# Patient Record
Sex: Male | Born: 1980
Health system: Southern US, Community
[De-identification: ages and names within clinical notes are randomized; demographics above are authoritative.]

## PROBLEM LIST (undated history)

## (undated) DIAGNOSIS — N289 Disorder of kidney and ureter, unspecified: Secondary | ICD-10-CM

## (undated) HISTORY — PX: TONSILLECTOMY: SUR1361

---

## 2010-09-12 ENCOUNTER — Emergency Department (HOSPITAL_COMMUNITY)
Admission: EM | Admit: 2010-09-12 | Discharge: 2010-09-12 | Disposition: A | Discharge status: Home / Self Care | Payer: Worker's Compensation | Attending: Emergency Medicine | Admitting: Emergency Medicine

## 2010-09-12 DIAGNOSIS — L089 Local infection of the skin and subcutaneous tissue, unspecified: Secondary | ICD-10-CM | POA: Insufficient documentation

## 2010-09-12 DIAGNOSIS — W57XXXA Bitten or stung by nonvenomous insect and other nonvenomous arthropods, initial encounter: Secondary | ICD-10-CM | POA: Insufficient documentation

## 2012-06-08 ENCOUNTER — Telehealth (HOSPITAL_COMMUNITY): Payer: Self-pay | Admitting: Dietician

## 2012-06-08 NOTE — Telephone Encounter (Signed)
Called pt at 1602. Appointment scheduled for 06/16/12 at 2:00 PM.

## 2012-06-08 NOTE — Telephone Encounter (Signed)
Edit to previous letter

## 2012-06-08 NOTE — Telephone Encounter (Signed)
Mailed appointment confirmation letter and instructions for appointment scheduled 06/16/12 via [place] [facility].

## 2012-06-08 NOTE — Telephone Encounter (Signed)
Received referral via fax from [facility] for dx: obesity.  

## 2012-06-16 ENCOUNTER — Telehealth (HOSPITAL_COMMUNITY): Payer: Self-pay | Admitting: Dietician

## 2012-06-16 NOTE — Telephone Encounter (Signed)
Pt was a no show for appointment scheduled for 06/16/12 at 2:00 PM. Mailed letter to pt home notifying pt of no-show and offering rescheduling appointment.

## 2013-09-08 ENCOUNTER — Other Ambulatory Visit (HOSPITAL_COMMUNITY): Payer: Self-pay | Admitting: Family Medicine

## 2013-09-09 ENCOUNTER — Other Ambulatory Visit (HOSPITAL_COMMUNITY): Payer: Self-pay | Admitting: Family Medicine

## 2013-09-09 DIAGNOSIS — M25519 Pain in unspecified shoulder: Secondary | ICD-10-CM

## 2013-09-17 ENCOUNTER — Other Ambulatory Visit (HOSPITAL_COMMUNITY): Payer: Worker's Compensation

## 2015-08-14 DIAGNOSIS — Z6835 Body mass index (BMI) 35.0-35.9, adult: Secondary | ICD-10-CM | POA: Diagnosis not present

## 2015-08-14 DIAGNOSIS — J019 Acute sinusitis, unspecified: Secondary | ICD-10-CM | POA: Diagnosis not present

## 2015-08-14 DIAGNOSIS — G894 Chronic pain syndrome: Secondary | ICD-10-CM | POA: Diagnosis not present

## 2015-08-14 DIAGNOSIS — J111 Influenza due to unidentified influenza virus with other respiratory manifestations: Secondary | ICD-10-CM | POA: Diagnosis not present

## 2015-08-14 DIAGNOSIS — Z1389 Encounter for screening for other disorder: Secondary | ICD-10-CM | POA: Diagnosis not present

## 2015-10-04 DIAGNOSIS — F17200 Nicotine dependence, unspecified, uncomplicated: Secondary | ICD-10-CM | POA: Diagnosis not present

## 2015-10-04 DIAGNOSIS — M549 Dorsalgia, unspecified: Secondary | ICD-10-CM | POA: Diagnosis not present

## 2015-10-04 DIAGNOSIS — N200 Calculus of kidney: Secondary | ICD-10-CM | POA: Diagnosis not present

## 2015-10-04 DIAGNOSIS — K5900 Constipation, unspecified: Secondary | ICD-10-CM | POA: Diagnosis not present

## 2015-11-26 DIAGNOSIS — R1032 Left lower quadrant pain: Secondary | ICD-10-CM | POA: Diagnosis not present

## 2015-11-26 DIAGNOSIS — F17200 Nicotine dependence, unspecified, uncomplicated: Secondary | ICD-10-CM | POA: Diagnosis not present

## 2015-11-26 DIAGNOSIS — Z87442 Personal history of urinary calculi: Secondary | ICD-10-CM | POA: Diagnosis not present

## 2015-11-26 DIAGNOSIS — N132 Hydronephrosis with renal and ureteral calculous obstruction: Secondary | ICD-10-CM | POA: Diagnosis not present

## 2015-11-26 DIAGNOSIS — N201 Calculus of ureter: Secondary | ICD-10-CM | POA: Diagnosis not present

## 2015-11-26 DIAGNOSIS — R000 Tachycardia, unspecified: Secondary | ICD-10-CM | POA: Diagnosis not present

## 2015-12-05 DIAGNOSIS — G894 Chronic pain syndrome: Secondary | ICD-10-CM | POA: Diagnosis not present

## 2015-12-05 DIAGNOSIS — N200 Calculus of kidney: Secondary | ICD-10-CM | POA: Diagnosis not present

## 2015-12-05 DIAGNOSIS — N202 Calculus of kidney with calculus of ureter: Secondary | ICD-10-CM | POA: Diagnosis not present

## 2015-12-05 DIAGNOSIS — F909 Attention-deficit hyperactivity disorder, unspecified type: Secondary | ICD-10-CM | POA: Diagnosis not present

## 2015-12-05 DIAGNOSIS — E748 Other specified disorders of carbohydrate metabolism: Secondary | ICD-10-CM | POA: Diagnosis not present

## 2015-12-05 DIAGNOSIS — Z1389 Encounter for screening for other disorder: Secondary | ICD-10-CM | POA: Diagnosis not present

## 2015-12-05 DIAGNOSIS — F419 Anxiety disorder, unspecified: Secondary | ICD-10-CM | POA: Diagnosis not present

## 2015-12-05 DIAGNOSIS — Z6834 Body mass index (BMI) 34.0-34.9, adult: Secondary | ICD-10-CM | POA: Diagnosis not present

## 2017-03-13 DIAGNOSIS — E669 Obesity, unspecified: Secondary | ICD-10-CM | POA: Diagnosis not present

## 2017-03-13 DIAGNOSIS — G894 Chronic pain syndrome: Secondary | ICD-10-CM | POA: Diagnosis not present

## 2017-03-13 DIAGNOSIS — Z1389 Encounter for screening for other disorder: Secondary | ICD-10-CM | POA: Diagnosis not present

## 2017-03-13 DIAGNOSIS — F419 Anxiety disorder, unspecified: Secondary | ICD-10-CM | POA: Diagnosis not present

## 2017-03-13 DIAGNOSIS — Z6834 Body mass index (BMI) 34.0-34.9, adult: Secondary | ICD-10-CM | POA: Diagnosis not present

## 2017-07-14 DIAGNOSIS — Z6835 Body mass index (BMI) 35.0-35.9, adult: Secondary | ICD-10-CM | POA: Diagnosis not present

## 2017-07-14 DIAGNOSIS — F419 Anxiety disorder, unspecified: Secondary | ICD-10-CM | POA: Diagnosis not present

## 2017-07-14 DIAGNOSIS — G894 Chronic pain syndrome: Secondary | ICD-10-CM | POA: Diagnosis not present

## 2017-07-14 DIAGNOSIS — Z1389 Encounter for screening for other disorder: Secondary | ICD-10-CM | POA: Diagnosis not present

## 2017-07-14 DIAGNOSIS — J329 Chronic sinusitis, unspecified: Secondary | ICD-10-CM | POA: Diagnosis not present

## 2017-07-14 DIAGNOSIS — E6609 Other obesity due to excess calories: Secondary | ICD-10-CM | POA: Diagnosis not present

## 2017-07-14 DIAGNOSIS — J309 Allergic rhinitis, unspecified: Secondary | ICD-10-CM | POA: Diagnosis not present

## 2017-07-14 DIAGNOSIS — E669 Obesity, unspecified: Secondary | ICD-10-CM | POA: Diagnosis not present

## 2017-09-15 ENCOUNTER — Other Ambulatory Visit: Payer: Self-pay

## 2017-09-15 ENCOUNTER — Emergency Department (HOSPITAL_COMMUNITY): Payer: BLUE CROSS/BLUE SHIELD

## 2017-09-15 ENCOUNTER — Encounter (HOSPITAL_COMMUNITY): Payer: Self-pay | Admitting: Emergency Medicine

## 2017-09-15 ENCOUNTER — Emergency Department (HOSPITAL_COMMUNITY)
Admission: EM | Admit: 2017-09-15 | Discharge: 2017-09-15 | Disposition: A | Discharge status: Home / Self Care | Payer: BLUE CROSS/BLUE SHIELD | Attending: Emergency Medicine | Admitting: Emergency Medicine

## 2017-09-15 DIAGNOSIS — Z87891 Personal history of nicotine dependence: Secondary | ICD-10-CM | POA: Diagnosis not present

## 2017-09-15 DIAGNOSIS — R05 Cough: Secondary | ICD-10-CM | POA: Diagnosis not present

## 2017-09-15 DIAGNOSIS — R0602 Shortness of breath: Secondary | ICD-10-CM | POA: Diagnosis not present

## 2017-09-15 DIAGNOSIS — J40 Bronchitis, not specified as acute or chronic: Secondary | ICD-10-CM | POA: Diagnosis not present

## 2017-09-15 DIAGNOSIS — Z79899 Other long term (current) drug therapy: Secondary | ICD-10-CM | POA: Insufficient documentation

## 2017-09-15 MED ORDER — AZITHROMYCIN 250 MG PO TABS: 250.0000 mg | ORAL_TABLET | Freq: Every day | ORAL | 0 refills | Status: DC

## 2017-09-15 MED ORDER — PROMETHAZINE-CODEINE 6.25-10 MG/5ML PO SYRP: 10.0000 mL | ORAL_SOLUTION | Freq: Four times a day (QID) | ORAL | 0 refills | Status: DC | PRN

## 2017-09-15 MED ORDER — DEXAMETHASONE SODIUM PHOSPHATE 4 MG/ML IJ SOLN
8.0000 mg | Freq: Once | INTRAMUSCULAR | Status: AC
  Administered 2017-09-15: 8 mg via INTRAMUSCULAR
  Filled 2017-09-15: qty 2

## 2017-09-15 NOTE — Discharge Instructions (Addendum)
Chest x-ray showed no pneumonia.  Prescription for antibiotic and cough syrup.  Increase fluids.  Continue your over-the-counter medications

## 2017-09-15 NOTE — ED Triage Notes (Signed)
Pt c/o of  Productive cough and congestion x 1 month.

## 2017-09-15 NOTE — ED Provider Notes (Signed)
[facility] EMERGENCY DEPARTMENT Provider Note   CSN: [id number] Arrival date & time: 09/15/17  4098     History   Chief Complaint Chief Complaint  Patient presents with  . Cough    HPI Willie Cook is a 37 y.o. male.  Patient presents with persistent cough for 1 month with associated productive sputum.  He blames some of this on allergies.  He was given an intramuscular injection (uncertain medication) at [facility] 6 weeks ago.  No fever, sweats, chills, weight loss.  Quit smoking several months ago.  Severity of symptoms is moderate.  Pollen makes symptoms worse.     History reviewed. No pertinent past medical history.  There are no active problems to display for this patient.   History reviewed. No pertinent surgical history.      Home Medications    Prior to Admission medications   Medication Sig Start Date End Date Taking? Authorizing Provider  ALPRAZolam Prudy Feeler) 0.5 MG tablet Take 1 tablet by mouth 4 (four) times daily as needed. 09/11/17   [provider]  azithromycin (ZITHROMAX) 250 MG tablet Take 1 tablet (250 mg total) by mouth daily. Take first 2 tablets together, then 1 every day until finished. 09/15/17   Donnetta Hutching, MD  HYDROcodone-acetaminophen [facility]) 10-325 MG tablet Take 1-2 tablets by mouth. 5 times a day as needed 09/11/17   [provider]  promethazine-codeine (PHENERGAN WITH CODEINE) 6.25-10 MG/5ML syrup Take 10 mLs by mouth every 6 (six) hours as needed for cough. 09/15/17   Donnetta Hutching, MD    Family History History reviewed. No pertinent family history.  Social History Social History   Tobacco Use  . Smoking status: Former Games developer  . Smokeless tobacco: Never Used  Substance Use Topics  . Alcohol use: Yes    Frequency: Never  . Drug use: Never     Allergies   Patient has no allergy information on record.   Review of Systems Review of Systems  All other systems reviewed and are negative.    Physical  Exam Updated Vital Signs BP 111/83   Pulse 84   Temp 98.9 F (37.2 C) (Oral)   Resp 16   Ht 6' (1.829 m)   Wt 117.9 kg (260 lb)   SpO2 99%   BMI 35.26 kg/m   Physical Exam  Constitutional: He is oriented to person, place, and time. He appears well-developed and well-nourished.  nad  HENT:  Head: Normocephalic and atraumatic.  Eyes: Conjunctivae are normal.  Neck: Neck supple.  Cardiovascular: Normal rate and regular rhythm.  Pulmonary/Chest: Effort normal and breath sounds normal.  Abdominal: Soft. Bowel sounds are normal.  Musculoskeletal: Normal range of motion.  Neurological: He is alert and oriented to person, place, and time.  Skin: Skin is warm and dry.  Psychiatric: He has a normal mood and affect. His behavior is normal.  Nursing note and vitals reviewed.    ED Treatments / Results  Labs (all labs ordered are listed, but only abnormal results are displayed) Labs Reviewed - No data to display  EKG None  Radiology Dg Chest 2 View  Result Date: 09/15/2017 CLINICAL DATA:  Productive cough, congestion, shortness of breath, and bilateral lower chest discomfort for the past month. Slight fever today. Former smoker. EXAM: CHEST - 2 VIEW COMPARISON:  None FINDINGS: The lungs are adequately inflated. The interstitial markings are coarse. There is no alveolar infiltrate or pleural effusion. Heart and pulmonary vascularity are normal. The mediastinum is normal  in width. The trachea is midline. There is no pleural effusion. IMPRESSION: There is no pneumonia. Mild chronic bronchitic-smoking related interstitial changes are present. Electronically Signed   By: David  [place] M.D.   On: 09/15/2017 07:39    Procedures Procedures (including critical care time)  Medications Ordered in ED Medications  dexamethasone (DECADRON) injection 8 mg (8 mg Intramuscular Given 09/15/17 1610)     Initial Impression / Assessment and Plan / ED Course  I have reviewed the triage vital signs  and the nursing notes.  Pertinent labs & imaging results that were available during my care of the patient were reviewed by me and considered in my medical decision making (see chart for details).     Presents with persistent cough for 1 month.  Chest x-ray shows no pneumonia.  Decadron 8 mg IM given in the ED.  Discharge medications Zithromax and Phenergan with codeine cough syrup.  Final Clinical Impressions(s) / ED Diagnoses   Final diagnoses:  Bronchitis    ED Discharge Orders        Ordered    azithromycin (ZITHROMAX) 250 MG tablet  Daily     09/15/17 0817    promethazine-codeine (PHENERGAN WITH CODEINE) 6.25-10 MG/5ML syrup  Every 6 hours PRN     09/15/17 0817       Donnetta Hutching, MD 09/15/17 [phone]

## 2017-12-03 DIAGNOSIS — F419 Anxiety disorder, unspecified: Secondary | ICD-10-CM | POA: Diagnosis not present

## 2017-12-03 DIAGNOSIS — Z1389 Encounter for screening for other disorder: Secondary | ICD-10-CM | POA: Diagnosis not present

## 2017-12-03 DIAGNOSIS — E6609 Other obesity due to excess calories: Secondary | ICD-10-CM | POA: Diagnosis not present

## 2017-12-03 DIAGNOSIS — G894 Chronic pain syndrome: Secondary | ICD-10-CM | POA: Diagnosis not present

## 2017-12-03 DIAGNOSIS — G4730 Sleep apnea, unspecified: Secondary | ICD-10-CM | POA: Diagnosis not present

## 2017-12-03 DIAGNOSIS — Z0001 Encounter for general adult medical examination with abnormal findings: Secondary | ICD-10-CM | POA: Diagnosis not present

## 2017-12-03 DIAGNOSIS — Z6835 Body mass index (BMI) 35.0-35.9, adult: Secondary | ICD-10-CM | POA: Diagnosis not present

## 2018-01-21 ENCOUNTER — Encounter (HOSPITAL_COMMUNITY): Payer: Self-pay | Admitting: Emergency Medicine

## 2018-01-21 ENCOUNTER — Emergency Department (HOSPITAL_COMMUNITY)
Admission: EM | Admit: 2018-01-21 | Discharge: 2018-01-21 | Disposition: A | Discharge status: Home / Self Care | Payer: BLUE CROSS/BLUE SHIELD | Attending: Emergency Medicine | Admitting: Emergency Medicine

## 2018-01-21 ENCOUNTER — Emergency Department (HOSPITAL_COMMUNITY): Payer: BLUE CROSS/BLUE SHIELD

## 2018-01-21 ENCOUNTER — Other Ambulatory Visit: Payer: Self-pay

## 2018-01-21 DIAGNOSIS — Z87891 Personal history of nicotine dependence: Secondary | ICD-10-CM | POA: Insufficient documentation

## 2018-01-21 DIAGNOSIS — R109 Unspecified abdominal pain: Secondary | ICD-10-CM | POA: Diagnosis present

## 2018-01-21 DIAGNOSIS — Z79899 Other long term (current) drug therapy: Secondary | ICD-10-CM | POA: Insufficient documentation

## 2018-01-21 DIAGNOSIS — N200 Calculus of kidney: Secondary | ICD-10-CM | POA: Diagnosis not present

## 2018-01-21 DIAGNOSIS — N132 Hydronephrosis with renal and ureteral calculous obstruction: Secondary | ICD-10-CM | POA: Diagnosis not present

## 2018-01-21 HISTORY — DX: Disorder of kidney and ureter, unspecified: N28.9

## 2018-01-21 LAB — URINALYSIS, ROUTINE W REFLEX MICROSCOPIC
Bilirubin Urine: NEGATIVE
Glucose, UA: NEGATIVE mg/dL
HGB URINE DIPSTICK: NEGATIVE
Ketones, ur: NEGATIVE mg/dL
Leukocytes, UA: NEGATIVE
NITRITE: NEGATIVE
Protein, ur: 30 mg/dL — AB
Specific Gravity, Urine: 1.028 (ref 1.005–1.030)
pH: 5 (ref 5.0–8.0)

## 2018-01-21 MED ORDER — HYDROMORPHONE HCL 1 MG/ML IJ SOLN
1.0000 mg | Freq: Once | INTRAMUSCULAR | Status: AC
  Administered 2018-01-21: 1 mg via INTRAVENOUS
  Filled 2018-01-21: qty 1

## 2018-01-21 MED ORDER — OXYCODONE-ACETAMINOPHEN 5-325 MG PO TABS: 2.0000 | ORAL_TABLET | ORAL | 0 refills | Status: DC | PRN

## 2018-01-21 MED ORDER — KETOROLAC TROMETHAMINE 30 MG/ML IJ SOLN
30.0000 mg | Freq: Once | INTRAMUSCULAR | Status: AC
  Administered 2018-01-21: 30 mg via INTRAVENOUS
  Filled 2018-01-21: qty 1

## 2018-01-21 MED ORDER — TAMSULOSIN HCL 0.4 MG PO CAPS: 0.4000 mg | ORAL_CAPSULE | Freq: Every day | ORAL | 0 refills | Status: DC

## 2018-01-21 MED ORDER — ONDANSETRON 4 MG PO TBDP: 4.0000 mg | ORAL_TABLET | Freq: Three times a day (TID) | ORAL | 0 refills | Status: DC | PRN

## 2018-01-21 MED ORDER — ONDANSETRON HCL 4 MG/2ML IJ SOLN
4.0000 mg | Freq: Once | INTRAMUSCULAR | Status: AC
  Administered 2018-01-21: 4 mg via INTRAVENOUS
  Filled 2018-01-21: qty 2

## 2018-01-21 NOTE — ED Triage Notes (Signed)
Pt reports left flank pain. Pt reports pain is constant and feels as if "have to have BM." pt denies any pain with urination. Pt reports history of kidney stones.

## 2018-01-21 NOTE — ED Provider Notes (Signed)
[facility] EMERGENCY DEPARTMENT Provider Note   CSN: [id number] Arrival date & time: 01/21/18  0746     History   Chief Complaint Chief Complaint  Patient presents with  . Flank Pain    HPI Willie Cook is a 37 y.o. male.  The history is provided by the patient. No language interpreter was used.  Flank Pain  This is a new problem. The current episode started 6 to 12 hours ago. The problem occurs constantly. The problem has not changed since onset.Associated symptoms include abdominal pain. Nothing aggravates the symptoms. Nothing relieves the symptoms. He has tried nothing for the symptoms. The treatment provided no relief.  Pt complains of pain in his lower abdomen and left back pain.  Pt reports pain feels like a kidney stone.  Pt has had in the past   Past Medical History:  Diagnosis Date  . Renal disorder    kidney stones    There are no active problems to display for this patient.   Past Surgical History:  Procedure Laterality Date  . TONSILLECTOMY          Home Medications    Prior to Admission medications   Medication Sig Start Date End Date Taking? Authorizing Provider  ALPRAZolam Prudy Feeler) 0.5 MG tablet Take 1 tablet by mouth 4 (four) times daily as needed. 09/11/17  Yes [provider]  azithromycin (ZITHROMAX) 250 MG tablet Take 1 tablet (250 mg total) by mouth daily. Take first 2 tablets together, then 1 every day until finished. Patient not taking: Reported on 01/21/2018 09/15/17   Donnetta Hutching, MD  promethazine-codeine [facility] WITH CODEINE) 6.25-10 MG/5ML syrup Take 10 mLs by mouth every 6 (six) hours as needed for cough. Patient not taking: Reported on 01/21/2018 09/15/17   Donnetta Hutching, MD    Family History History reviewed. No pertinent family history.  Social History Social History   Tobacco Use  . Smoking status: Former Games developer  . Smokeless tobacco: Never Used  Substance Use Topics  . Alcohol use: Yes    Frequency: Never    Comment:  occ  . Drug use: Never     Allergies   Patient has no allergy information on record.   Review of Systems Review of Systems  Gastrointestinal: Positive for abdominal pain.  Genitourinary: Positive for flank pain.  All other systems reviewed and are negative.    Physical Exam Updated Vital Signs BP (!) 136/92 (BP Location: Right Arm)   Pulse (!) 101   Temp (!) 97.5 F (36.4 C) (Oral) Comment: pt drinking water at time of calling for triage.  Resp 18   Ht 6' (1.829 m)   Wt 117.9 kg   SpO2 98%   BMI 35.26 kg/m   Physical Exam  Constitutional: He appears well-developed and well-nourished.  HENT:  Head: Normocephalic and atraumatic.  Eyes: Conjunctivae are normal.  Neck: Neck supple.  Cardiovascular: Normal rate and regular rhythm.  No murmur heard. Pulmonary/Chest: Effort normal and breath sounds normal. No respiratory distress.  Abdominal: Soft. There is tenderness.  Musculoskeletal: Normal range of motion. He exhibits no edema.  Neurological: He is alert.  Skin: Skin is warm and dry.  Psychiatric: He has a normal mood and affect.  Nursing note and vitals reviewed.    ED Treatments / Results  Labs (all labs ordered are listed, but only abnormal results are displayed) Labs Reviewed  URINALYSIS, ROUTINE W REFLEX MICROSCOPIC - Abnormal; Notable for the following components:      Result  Value   Color, Urine AMBER (*)    APPearance CLOUDY (*)    Protein, ur 30 (*)    Bacteria, UA RARE (*)    All other components within normal limits    EKG None  Radiology Ct Renal Stone Study  Result Date: 01/21/2018 CLINICAL DATA:  Left flank pain today. EXAM: CT ABDOMEN AND PELVIS WITHOUT CONTRAST TECHNIQUE: Multidetector CT imaging of the abdomen and pelvis was performed following the standard protocol without IV contrast. COMPARISON:  None. FINDINGS: Lower chest: The lung bases are clear of acute process. No pleural effusion or pulmonary lesions. The heart is normal in  size. No pericardial effusion. The distal esophagus and aorta are unremarkable. Hepatobiliary: Advanced diffuse fatty infiltration of the liver but no focal hepatic lesions or intrahepatic biliary dilatation. The gallbladder appears normal. No common bile duct dilatation. Pancreas: No mass, inflammation or ductal dilatation. Spleen: Normal size.  No focal lesions. Adrenals/Urinary Tract: The adrenal glands are normal. Mild left-sided hydroureteronephrosis down to a 1-2 mm calculus in the distal left ureter. No renal calculi. No worrisome renal lesions. No right-sided ureteral calculi. No bladder calculi. Stomach/Bowel: The stomach, duodenum, small bowel and colon are grossly normal without oral contrast. No inflammatory changes, mass lesions or obstructive findings. The terminal ileum and appendix are normal. Vascular/Lymphatic: The aorta is normal in caliber. No atheroscerlotic calcifications. No mesenteric of retroperitoneal mass or adenopathy. Small scattered lymph nodes are noted. Retroaortic left renal vein. Reproductive: The prostate gland and seminal vesicles are unremarkable. Other: No pelvic mass or adenopathy. No free pelvic fluid collections. No inguinal mass or adenopathy. No abdominal wall hernia or subcutaneous lesions. Musculoskeletal: No significant bony findings. IMPRESSION: 1-2 mm distal left ureteral calculus with mild left-sided hydroureteronephrosis accounting for the patient's left flank pain. No renal calculi or worrisome renal or bladder lesions without contrast. Marked diffuse fatty infiltration of the liver. Electronically Signed   By: Rudie Meyer M.D.   On: 01/21/2018 09:45    Procedures Procedures (including critical care time)  Medications Ordered in ED Medications  ondansetron (ZOFRAN) injection 4 mg (4 mg Intravenous Given 01/21/18 0831)  HYDROmorphone (DILAUDID) injection 1 mg (1 mg Intravenous Given 01/21/18 0833)  ketorolac (TORADOL) 30 MG/ML injection 30 mg (30 mg  Intravenous Given 01/21/18 1015)  HYDROmorphone (DILAUDID) injection 1 mg (1 mg Intravenous Given 01/21/18 1016)     Initial Impression / Assessment and Plan / ED Course  I have reviewed the triage vital signs and the nursing notes.  Pertinent labs & imaging results that were available during my care of the patient were reviewed by me and considered in my medical decision making (see chart for details).     MDM   Ct scan shows a distal left 2mm calculus.  Pt given dilaudid, torodol,   Pt advised of results.  Pt advised to strain urine, follow up with urologist for evaluation    Final Clinical Impressions(s) / ED Diagnoses   Final diagnoses:  Kidney stone    ED Discharge Orders         Ordered    tamsulosin (FLOMAX) 0.4 MG CAPS capsule  Daily     01/21/18 1102    oxyCODONE-acetaminophen (PERCOCET/ROXICET) 5-325 MG tablet  Every 4 hours PRN     01/21/18 1102    ondansetron (ZOFRAN ODT) 4 MG disintegrating tablet  Every 8 hours PRN     01/21/18 1102        An After Visit Summary was printed and given  to the patient.    Elson Areas, PA-C 01/21/18 1102    Samuel Jester, DO 01/25/18 1332

## 2018-03-02 DIAGNOSIS — F419 Anxiety disorder, unspecified: Secondary | ICD-10-CM | POA: Diagnosis not present

## 2018-03-02 DIAGNOSIS — Z1389 Encounter for screening for other disorder: Secondary | ICD-10-CM | POA: Diagnosis not present

## 2018-03-02 DIAGNOSIS — G894 Chronic pain syndrome: Secondary | ICD-10-CM | POA: Diagnosis not present

## 2018-03-02 DIAGNOSIS — G4730 Sleep apnea, unspecified: Secondary | ICD-10-CM | POA: Diagnosis not present

## 2018-03-02 DIAGNOSIS — G4700 Insomnia, unspecified: Secondary | ICD-10-CM | POA: Diagnosis not present

## 2018-03-02 DIAGNOSIS — Z681 Body mass index (BMI) 19 or less, adult: Secondary | ICD-10-CM | POA: Diagnosis not present

## 2018-03-02 DIAGNOSIS — Z23 Encounter for immunization: Secondary | ICD-10-CM | POA: Diagnosis not present

## 2018-03-26 DIAGNOSIS — G4733 Obstructive sleep apnea (adult) (pediatric): Secondary | ICD-10-CM | POA: Diagnosis not present

## 2018-06-05 DIAGNOSIS — S91342A Puncture wound with foreign body, left foot, initial encounter: Secondary | ICD-10-CM | POA: Diagnosis not present

## 2018-06-05 DIAGNOSIS — S90852A Superficial foreign body, left foot, initial encounter: Secondary | ICD-10-CM | POA: Diagnosis not present

## 2018-07-28 DIAGNOSIS — G894 Chronic pain syndrome: Secondary | ICD-10-CM | POA: Diagnosis not present

## 2018-07-28 DIAGNOSIS — Z1389 Encounter for screening for other disorder: Secondary | ICD-10-CM | POA: Diagnosis not present

## 2018-07-28 DIAGNOSIS — Z6835 Body mass index (BMI) 35.0-35.9, adult: Secondary | ICD-10-CM | POA: Diagnosis not present

## 2018-07-28 DIAGNOSIS — E6609 Other obesity due to excess calories: Secondary | ICD-10-CM | POA: Diagnosis not present

## 2018-07-28 DIAGNOSIS — J301 Allergic rhinitis due to pollen: Secondary | ICD-10-CM | POA: Diagnosis not present

## 2018-11-17 DIAGNOSIS — E6609 Other obesity due to excess calories: Secondary | ICD-10-CM | POA: Diagnosis not present

## 2018-11-17 DIAGNOSIS — Z1389 Encounter for screening for other disorder: Secondary | ICD-10-CM | POA: Diagnosis not present

## 2018-11-17 DIAGNOSIS — K219 Gastro-esophageal reflux disease without esophagitis: Secondary | ICD-10-CM | POA: Diagnosis not present

## 2018-11-17 DIAGNOSIS — G894 Chronic pain syndrome: Secondary | ICD-10-CM | POA: Diagnosis not present

## 2018-11-17 DIAGNOSIS — Z6836 Body mass index (BMI) 36.0-36.9, adult: Secondary | ICD-10-CM | POA: Diagnosis not present

## 2019-06-09 DIAGNOSIS — F419 Anxiety disorder, unspecified: Secondary | ICD-10-CM | POA: Diagnosis not present

## 2019-06-09 DIAGNOSIS — J329 Chronic sinusitis, unspecified: Secondary | ICD-10-CM | POA: Diagnosis not present

## 2019-06-09 DIAGNOSIS — G894 Chronic pain syndrome: Secondary | ICD-10-CM | POA: Diagnosis not present

## 2019-06-09 DIAGNOSIS — E6609 Other obesity due to excess calories: Secondary | ICD-10-CM | POA: Diagnosis not present

## 2019-06-09 DIAGNOSIS — Z1389 Encounter for screening for other disorder: Secondary | ICD-10-CM | POA: Diagnosis not present

## 2019-06-09 DIAGNOSIS — Z6835 Body mass index (BMI) 35.0-35.9, adult: Secondary | ICD-10-CM | POA: Diagnosis not present

## 2019-07-21 DIAGNOSIS — G894 Chronic pain syndrome: Secondary | ICD-10-CM | POA: Diagnosis not present

## 2019-07-21 DIAGNOSIS — E6609 Other obesity due to excess calories: Secondary | ICD-10-CM | POA: Diagnosis not present

## 2019-07-21 DIAGNOSIS — G4726 Circadian rhythm sleep disorder, shift work type: Secondary | ICD-10-CM | POA: Diagnosis not present

## 2019-07-21 DIAGNOSIS — Z6835 Body mass index (BMI) 35.0-35.9, adult: Secondary | ICD-10-CM | POA: Diagnosis not present

## 2019-09-07 DIAGNOSIS — Z6834 Body mass index (BMI) 34.0-34.9, adult: Secondary | ICD-10-CM | POA: Diagnosis not present

## 2019-09-07 DIAGNOSIS — R635 Abnormal weight gain: Secondary | ICD-10-CM | POA: Diagnosis not present

## 2019-09-07 DIAGNOSIS — G894 Chronic pain syndrome: Secondary | ICD-10-CM | POA: Diagnosis not present

## 2019-09-07 DIAGNOSIS — E6609 Other obesity due to excess calories: Secondary | ICD-10-CM | POA: Diagnosis not present

## 2019-10-27 DIAGNOSIS — E6609 Other obesity due to excess calories: Secondary | ICD-10-CM | POA: Diagnosis not present

## 2019-10-27 DIAGNOSIS — M26621 Arthralgia of right temporomandibular joint: Secondary | ICD-10-CM | POA: Diagnosis not present

## 2019-10-27 DIAGNOSIS — Z6834 Body mass index (BMI) 34.0-34.9, adult: Secondary | ICD-10-CM | POA: Diagnosis not present

## 2019-10-27 DIAGNOSIS — G894 Chronic pain syndrome: Secondary | ICD-10-CM | POA: Diagnosis not present

## 2019-12-08 DIAGNOSIS — Z23 Encounter for immunization: Secondary | ICD-10-CM | POA: Diagnosis not present

## 2019-12-21 DIAGNOSIS — G894 Chronic pain syndrome: Secondary | ICD-10-CM | POA: Diagnosis not present

## 2019-12-21 DIAGNOSIS — E6609 Other obesity due to excess calories: Secondary | ICD-10-CM | POA: Diagnosis not present

## 2019-12-21 DIAGNOSIS — Z6833 Body mass index (BMI) 33.0-33.9, adult: Secondary | ICD-10-CM | POA: Diagnosis not present

## 2019-12-21 DIAGNOSIS — G4709 Other insomnia: Secondary | ICD-10-CM | POA: Diagnosis not present

## 2020-01-14 IMAGING — CT CT RENAL STONE PROTOCOL
2 of 4 series · 16 of 46 positions shown, 18 images · non-contrast
Comparison: None.

CLINICAL DATA: Left flank pain today.

EXAM:
CT ABDOMEN AND PELVIS WITHOUT CONTRAST
TECHNIQUE: Multidetector CT imaging of the abdomen and pelvis was performed
following the standard protocol without IV contrast.

[Series 2: axial st · axial · 0.93mm/px · z∈[+926,+1432]mm · 13 of 111 slices shown, 15 images]
[im 5/111  soft-tissue]
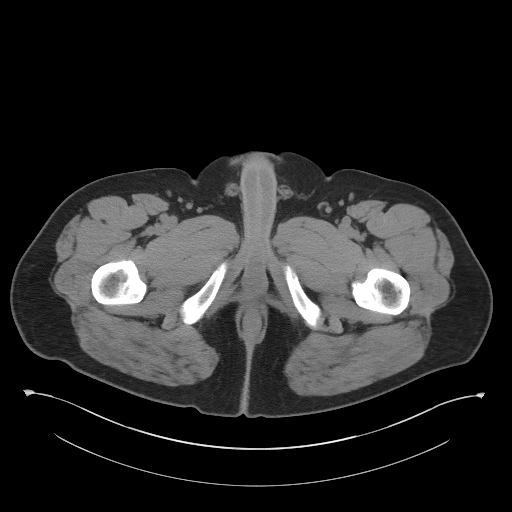
[im 5/111  bone]
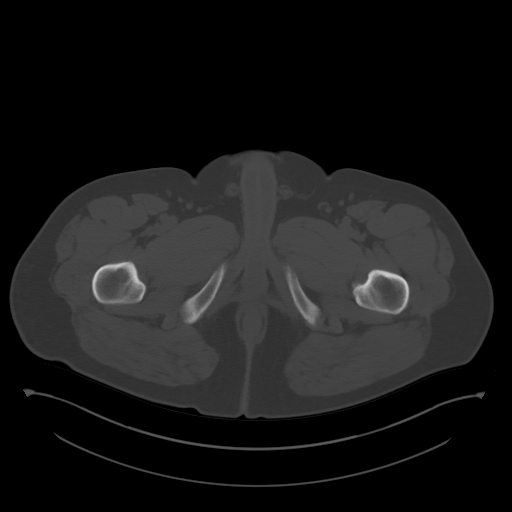
[im 14/111  soft-tissue]
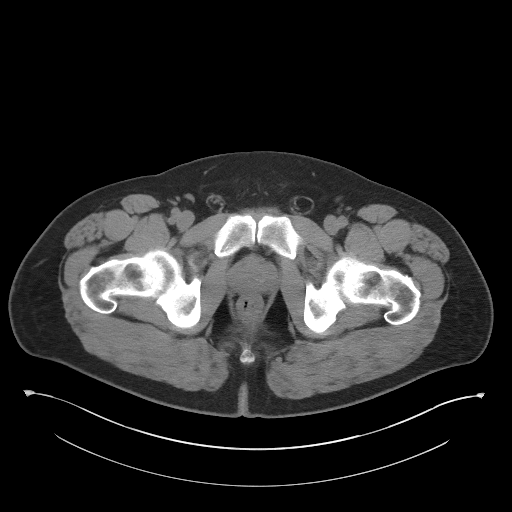
[im 23/111  soft-tissue]
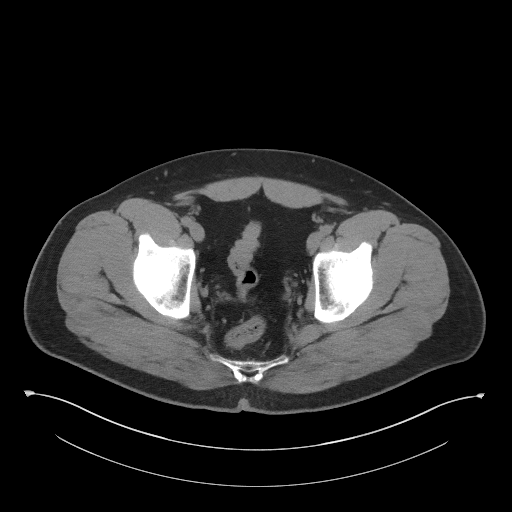
[im 33/111  soft-tissue]
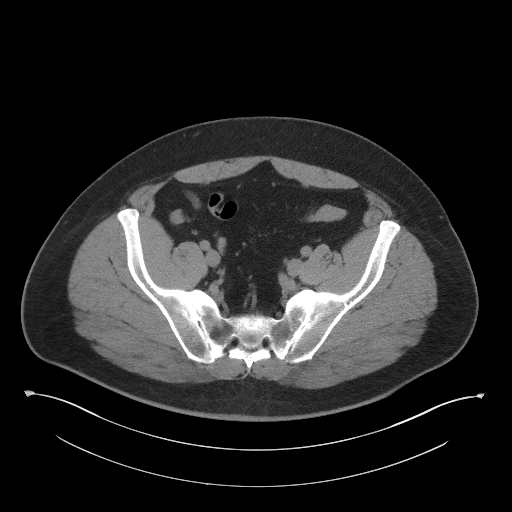
[im 37/111  soft-tissue]
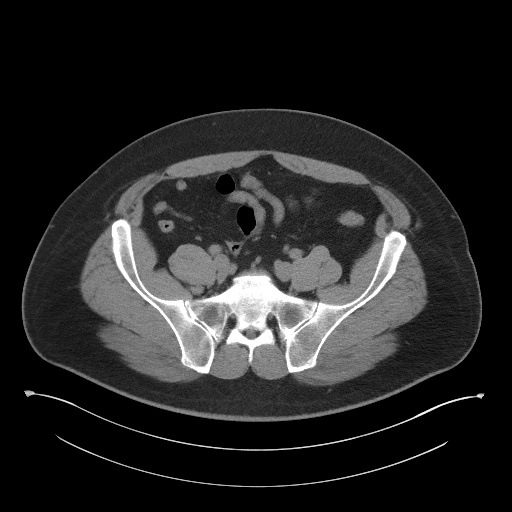
[im 46/111  soft-tissue]
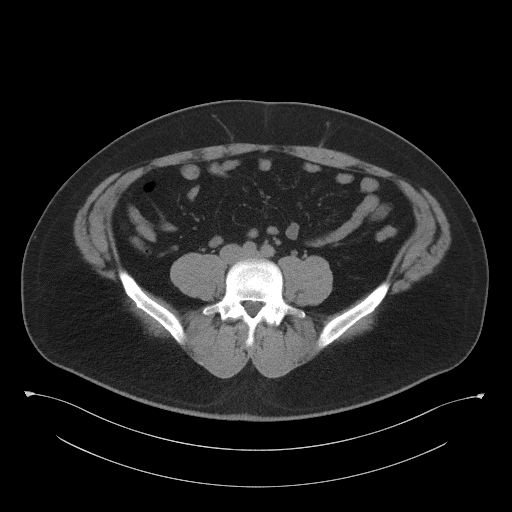
[im 56/111  soft-tissue]
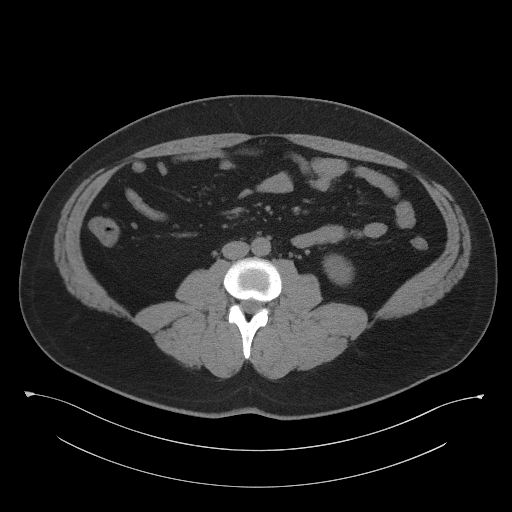
[im 65/111  soft-tissue]
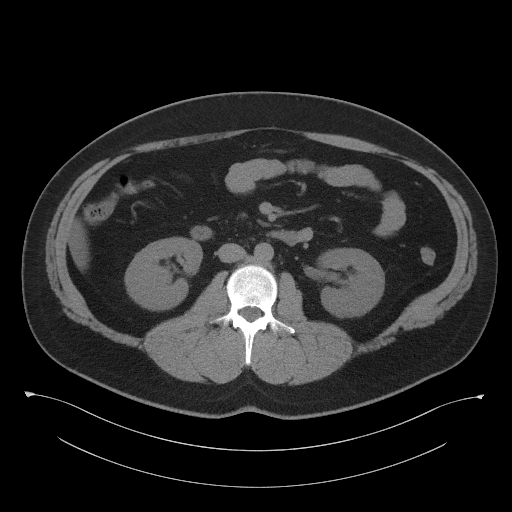
[im 74/111  soft-tissue]
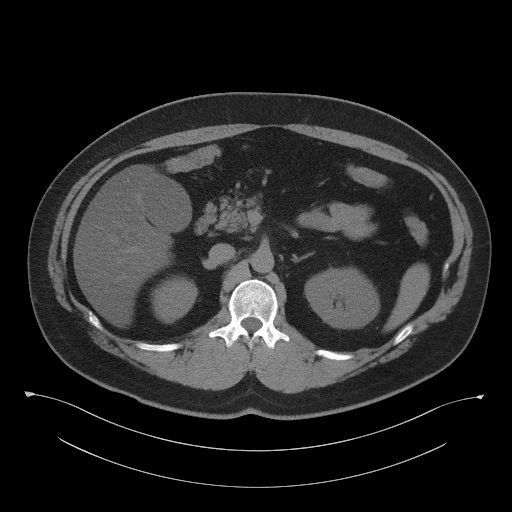
[im 74/111  bone]
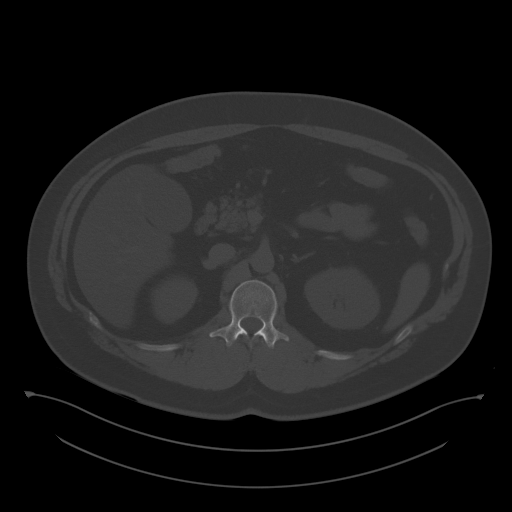
[im 78/111  soft-tissue]
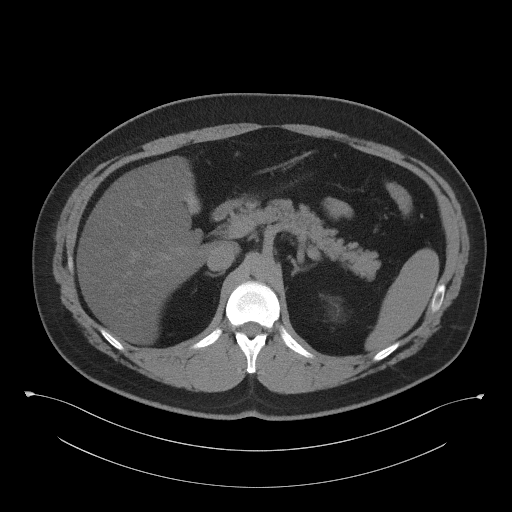
[im 88/111  soft-tissue]
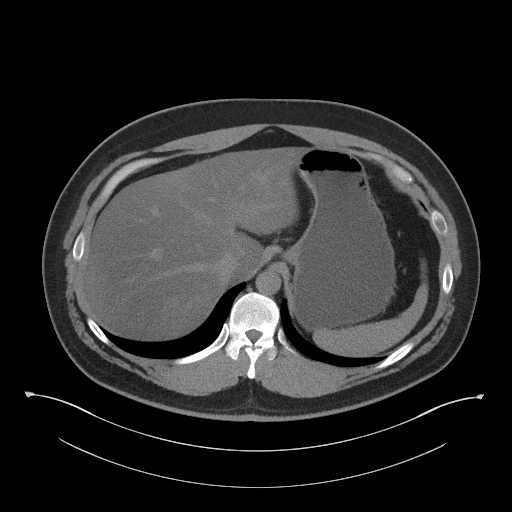
[im 97/111  soft-tissue]
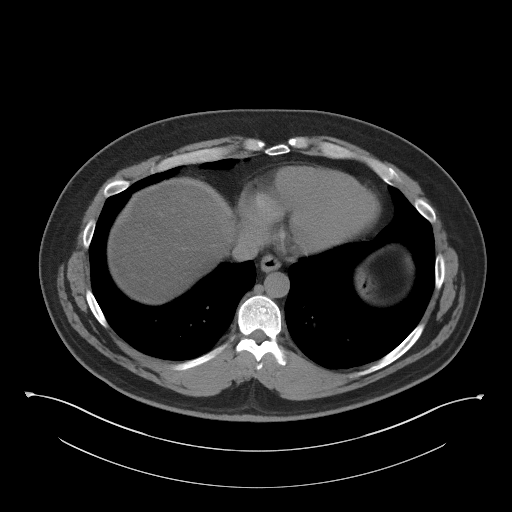
[im 106/111  soft-tissue]
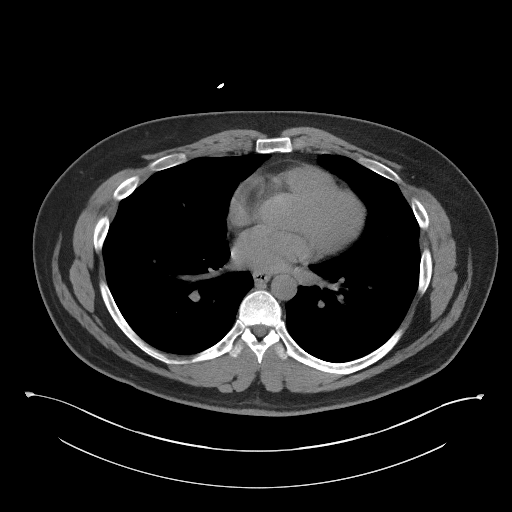

[Series 5: coronal st · coronal · 0.93mm/px · 3 of 96 slices shown]
[im 32/96  soft-tissue]
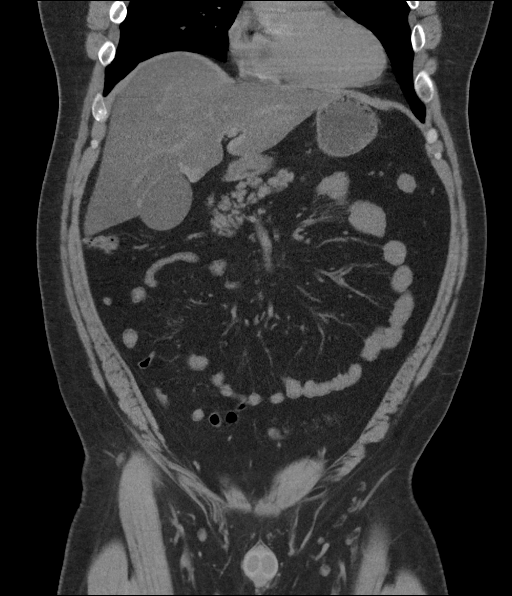
[im 43/96  soft-tissue]
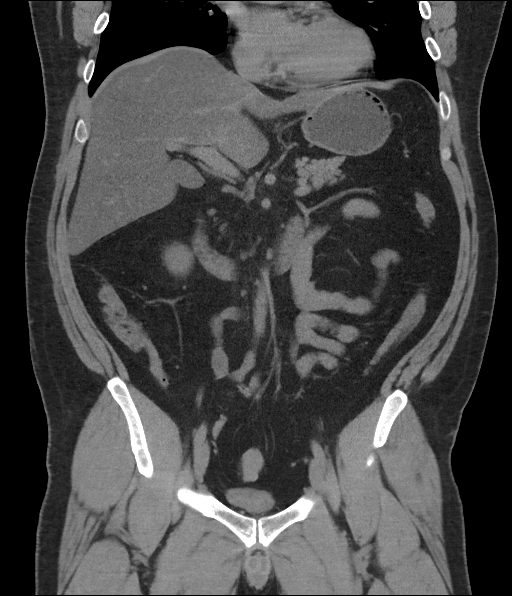
[im 53/96  soft-tissue]
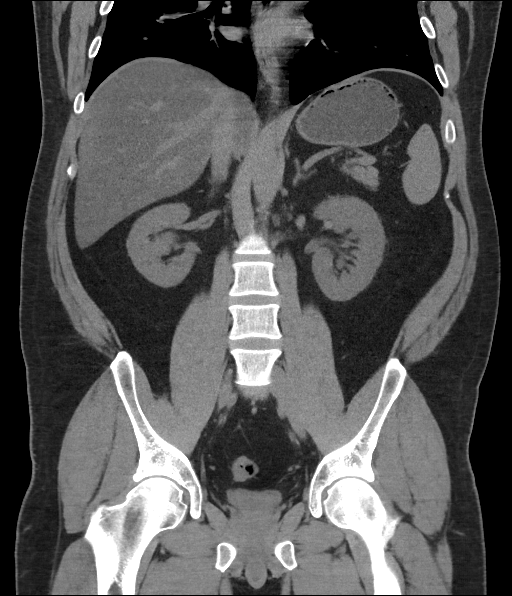

[16 of 46 positions shown; findings below may reference images not displayed]

FINDINGS: Lower chest: The lung bases are clear of acute process. No pleural
effusion or pulmonary lesions. The heart is normal in size. No
pericardial effusion. The distal esophagus and aorta are
unremarkable.

Hepatobiliary: Advanced diffuse fatty infiltration of the liver but
no focal hepatic lesions or intrahepatic biliary dilatation. The
gallbladder appears normal. No common bile duct dilatation.

Pancreas: No mass, inflammation or ductal dilatation.

Spleen: Normal size.  No focal lesions.

Adrenals/Urinary Tract: The adrenal glands are normal.

Mild left-sided hydroureteronephrosis down to a 1-2 mm calculus in
the distal left ureter. No renal calculi. No worrisome renal
lesions. No right-sided ureteral calculi. No bladder calculi.

Stomach/Bowel: The stomach, duodenum, small bowel and colon are
grossly normal without oral contrast. No inflammatory changes, mass
lesions or obstructive findings. The terminal ileum and appendix are
normal.

Vascular/Lymphatic: The aorta is normal in caliber. No
atheroscerlotic calcifications. No mesenteric of retroperitoneal
mass or adenopathy. Small scattered lymph nodes are noted.
Retroaortic left renal vein.

Reproductive: The prostate gland and seminal vesicles are
unremarkable.

Other: No pelvic mass or adenopathy. No free pelvic fluid
collections. No inguinal mass or adenopathy. No abdominal wall
hernia or subcutaneous lesions.

Musculoskeletal: No significant bony findings.
IMPRESSION: 1-2 mm distal left ureteral calculus with mild left-sided
hydroureteronephrosis accounting for the patient's left flank pain.

No renal calculi or worrisome renal or bladder lesions without
contrast.

Marked diffuse fatty infiltration of the liver.

## 2020-01-19 DIAGNOSIS — Z23 Encounter for immunization: Secondary | ICD-10-CM | POA: Diagnosis not present

## 2020-02-16 DIAGNOSIS — E6609 Other obesity due to excess calories: Secondary | ICD-10-CM | POA: Diagnosis not present

## 2020-02-16 DIAGNOSIS — K219 Gastro-esophageal reflux disease without esophagitis: Secondary | ICD-10-CM | POA: Diagnosis not present

## 2020-02-16 DIAGNOSIS — N521 Erectile dysfunction due to diseases classified elsewhere: Secondary | ICD-10-CM | POA: Diagnosis not present

## 2020-02-16 DIAGNOSIS — Z6833 Body mass index (BMI) 33.0-33.9, adult: Secondary | ICD-10-CM | POA: Diagnosis not present

## 2020-02-16 DIAGNOSIS — G894 Chronic pain syndrome: Secondary | ICD-10-CM | POA: Diagnosis not present

## 2020-04-19 DIAGNOSIS — G894 Chronic pain syndrome: Secondary | ICD-10-CM | POA: Diagnosis not present

## 2020-04-19 DIAGNOSIS — J019 Acute sinusitis, unspecified: Secondary | ICD-10-CM | POA: Diagnosis not present

## 2020-05-24 DIAGNOSIS — J22 Unspecified acute lower respiratory infection: Secondary | ICD-10-CM | POA: Diagnosis not present

## 2020-05-24 DIAGNOSIS — G894 Chronic pain syndrome: Secondary | ICD-10-CM | POA: Diagnosis not present

## 2020-05-24 DIAGNOSIS — N529 Male erectile dysfunction, unspecified: Secondary | ICD-10-CM | POA: Diagnosis not present

## 2020-06-26 DIAGNOSIS — Z20822 Contact with and (suspected) exposure to covid-19: Secondary | ICD-10-CM | POA: Diagnosis not present

## 2020-06-26 DIAGNOSIS — J029 Acute pharyngitis, unspecified: Secondary | ICD-10-CM | POA: Diagnosis not present

## 2020-08-02 DIAGNOSIS — G894 Chronic pain syndrome: Secondary | ICD-10-CM | POA: Diagnosis not present

## 2020-08-02 DIAGNOSIS — J40 Bronchitis, not specified as acute or chronic: Secondary | ICD-10-CM | POA: Diagnosis not present

## 2020-08-02 DIAGNOSIS — Z681 Body mass index (BMI) 19 or less, adult: Secondary | ICD-10-CM | POA: Diagnosis not present

## 2020-08-08 ENCOUNTER — Other Ambulatory Visit (HOSPITAL_COMMUNITY): Payer: Self-pay | Admitting: Physician Assistant

## 2020-08-08 DIAGNOSIS — R053 Chronic cough: Secondary | ICD-10-CM

## 2020-08-11 ENCOUNTER — Other Ambulatory Visit: Payer: Self-pay

## 2020-08-11 ENCOUNTER — Ambulatory Visit (HOSPITAL_COMMUNITY)
Admission: RE | Admit: 2020-08-11 | Discharge: 2020-08-11 | Disposition: A | Discharge status: Home / Self Care | Payer: BC Managed Care – PPO | Source: Ambulatory Visit | Attending: Physician Assistant | Admitting: Physician Assistant

## 2020-08-11 DIAGNOSIS — R053 Chronic cough: Secondary | ICD-10-CM | POA: Diagnosis not present

## 2020-08-11 DIAGNOSIS — R059 Cough, unspecified: Secondary | ICD-10-CM | POA: Diagnosis not present

## 2020-09-04 ENCOUNTER — Encounter (INDEPENDENT_AMBULATORY_CARE_PROVIDER_SITE_OTHER): Payer: Self-pay | Admitting: *Deleted

## 2020-10-25 DIAGNOSIS — G894 Chronic pain syndrome: Secondary | ICD-10-CM | POA: Diagnosis not present

## 2020-10-25 DIAGNOSIS — K219 Gastro-esophageal reflux disease without esophagitis: Secondary | ICD-10-CM | POA: Diagnosis not present

## 2020-11-22 DIAGNOSIS — K2100 Gastro-esophageal reflux disease with esophagitis, without bleeding: Secondary | ICD-10-CM | POA: Diagnosis not present

## 2020-11-22 DIAGNOSIS — R1011 Right upper quadrant pain: Secondary | ICD-10-CM | POA: Diagnosis not present

## 2020-11-23 ENCOUNTER — Other Ambulatory Visit: Payer: Self-pay | Admitting: Physician Assistant

## 2020-11-23 DIAGNOSIS — R1011 Right upper quadrant pain: Secondary | ICD-10-CM

## 2020-12-02 DIAGNOSIS — Z20822 Contact with and (suspected) exposure to covid-19: Secondary | ICD-10-CM | POA: Diagnosis not present

## 2020-12-07 DIAGNOSIS — G894 Chronic pain syndrome: Secondary | ICD-10-CM | POA: Diagnosis not present

## 2020-12-07 DIAGNOSIS — Z6836 Body mass index (BMI) 36.0-36.9, adult: Secondary | ICD-10-CM | POA: Diagnosis not present

## 2020-12-07 DIAGNOSIS — G4733 Obstructive sleep apnea (adult) (pediatric): Secondary | ICD-10-CM | POA: Diagnosis not present

## 2020-12-07 DIAGNOSIS — E6609 Other obesity due to excess calories: Secondary | ICD-10-CM | POA: Diagnosis not present

## 2020-12-11 ENCOUNTER — Other Ambulatory Visit: Payer: BC Managed Care – PPO

## 2020-12-15 ENCOUNTER — Other Ambulatory Visit: Payer: Self-pay

## 2020-12-15 ENCOUNTER — Ambulatory Visit
Admission: RE | Admit: 2020-12-15 | Discharge: 2020-12-15 | Disposition: A | Discharge status: Home / Self Care | Payer: BC Managed Care – PPO | Source: Ambulatory Visit | Attending: Physician Assistant | Admitting: Physician Assistant

## 2020-12-15 DIAGNOSIS — K760 Fatty (change of) liver, not elsewhere classified: Secondary | ICD-10-CM | POA: Diagnosis not present

## 2020-12-15 DIAGNOSIS — R1011 Right upper quadrant pain: Secondary | ICD-10-CM | POA: Diagnosis not present

## 2020-12-25 DIAGNOSIS — G4733 Obstructive sleep apnea (adult) (pediatric): Secondary | ICD-10-CM | POA: Diagnosis not present

## 2020-12-28 DIAGNOSIS — K2980 Duodenitis without bleeding: Secondary | ICD-10-CM | POA: Diagnosis not present

## 2020-12-28 DIAGNOSIS — K219 Gastro-esophageal reflux disease without esophagitis: Secondary | ICD-10-CM | POA: Diagnosis not present

## 2020-12-28 DIAGNOSIS — K2930 Chronic superficial gastritis without bleeding: Secondary | ICD-10-CM | POA: Diagnosis not present

## 2020-12-28 DIAGNOSIS — R12 Heartburn: Secondary | ICD-10-CM | POA: Diagnosis not present

## 2020-12-28 DIAGNOSIS — K222 Esophageal obstruction: Secondary | ICD-10-CM | POA: Diagnosis not present

## 2020-12-28 DIAGNOSIS — R1310 Dysphagia, unspecified: Secondary | ICD-10-CM | POA: Diagnosis not present

## 2020-12-28 DIAGNOSIS — K269 Duodenal ulcer, unspecified as acute or chronic, without hemorrhage or perforation: Secondary | ICD-10-CM | POA: Diagnosis not present

## 2020-12-29 ENCOUNTER — Other Ambulatory Visit: Payer: Self-pay | Admitting: Gastroenterology

## 2020-12-29 ENCOUNTER — Other Ambulatory Visit: Payer: Self-pay

## 2020-12-29 ENCOUNTER — Ambulatory Visit
Admission: RE | Admit: 2020-12-29 | Discharge: 2020-12-29 | Disposition: A | Discharge status: Home / Self Care | Payer: BC Managed Care – PPO | Source: Ambulatory Visit | Attending: Gastroenterology | Admitting: Gastroenterology

## 2020-12-29 DIAGNOSIS — R509 Fever, unspecified: Secondary | ICD-10-CM

## 2020-12-29 DIAGNOSIS — R5383 Other fatigue: Secondary | ICD-10-CM | POA: Diagnosis not present

## 2021-01-01 ENCOUNTER — Ambulatory Visit (INDEPENDENT_AMBULATORY_CARE_PROVIDER_SITE_OTHER): Payer: BC Managed Care – PPO | Admitting: Gastroenterology

## 2021-01-01 DIAGNOSIS — R109 Unspecified abdominal pain: Secondary | ICD-10-CM | POA: Diagnosis not present

## 2021-01-01 DIAGNOSIS — R509 Fever, unspecified: Secondary | ICD-10-CM | POA: Diagnosis not present

## 2021-01-25 DIAGNOSIS — G4733 Obstructive sleep apnea (adult) (pediatric): Secondary | ICD-10-CM | POA: Diagnosis not present

## 2021-01-31 DIAGNOSIS — E6609 Other obesity due to excess calories: Secondary | ICD-10-CM | POA: Diagnosis not present

## 2021-01-31 DIAGNOSIS — E669 Obesity, unspecified: Secondary | ICD-10-CM | POA: Diagnosis not present

## 2021-01-31 DIAGNOSIS — G894 Chronic pain syndrome: Secondary | ICD-10-CM | POA: Diagnosis not present

## 2021-01-31 DIAGNOSIS — K219 Gastro-esophageal reflux disease without esophagitis: Secondary | ICD-10-CM | POA: Diagnosis not present

## 2021-02-24 DIAGNOSIS — G4733 Obstructive sleep apnea (adult) (pediatric): Secondary | ICD-10-CM | POA: Diagnosis not present

## 2021-03-20 DIAGNOSIS — G894 Chronic pain syndrome: Secondary | ICD-10-CM | POA: Diagnosis not present

## 2021-03-20 DIAGNOSIS — K219 Gastro-esophageal reflux disease without esophagitis: Secondary | ICD-10-CM | POA: Diagnosis not present

## 2021-03-20 DIAGNOSIS — J329 Chronic sinusitis, unspecified: Secondary | ICD-10-CM | POA: Diagnosis not present

## 2021-05-23 DIAGNOSIS — J329 Chronic sinusitis, unspecified: Secondary | ICD-10-CM | POA: Diagnosis not present

## 2021-07-04 DIAGNOSIS — Z6837 Body mass index (BMI) 37.0-37.9, adult: Secondary | ICD-10-CM | POA: Diagnosis not present

## 2021-07-04 DIAGNOSIS — G894 Chronic pain syndrome: Secondary | ICD-10-CM | POA: Diagnosis not present

## 2021-07-04 DIAGNOSIS — E6609 Other obesity due to excess calories: Secondary | ICD-10-CM | POA: Diagnosis not present

## 2021-07-04 DIAGNOSIS — J329 Chronic sinusitis, unspecified: Secondary | ICD-10-CM | POA: Diagnosis not present

## 2021-07-12 ENCOUNTER — Encounter: Payer: Self-pay | Admitting: *Deleted

## 2021-09-03 DIAGNOSIS — G894 Chronic pain syndrome: Secondary | ICD-10-CM | POA: Diagnosis not present

## 2021-09-03 DIAGNOSIS — K219 Gastro-esophageal reflux disease without esophagitis: Secondary | ICD-10-CM | POA: Diagnosis not present

## 2021-09-03 DIAGNOSIS — J329 Chronic sinusitis, unspecified: Secondary | ICD-10-CM | POA: Diagnosis not present

## 2021-09-06 ENCOUNTER — Ambulatory Visit: Payer: BC Managed Care – PPO

## 2021-09-10 DIAGNOSIS — Z20822 Contact with and (suspected) exposure to covid-19: Secondary | ICD-10-CM | POA: Diagnosis not present

## 2021-10-15 DIAGNOSIS — G4733 Obstructive sleep apnea (adult) (pediatric): Secondary | ICD-10-CM | POA: Diagnosis not present

## 2021-10-15 DIAGNOSIS — G894 Chronic pain syndrome: Secondary | ICD-10-CM | POA: Diagnosis not present

## 2021-10-15 DIAGNOSIS — M47812 Spondylosis without myelopathy or radiculopathy, cervical region: Secondary | ICD-10-CM | POA: Diagnosis not present

## 2021-10-17 ENCOUNTER — Encounter: Payer: Self-pay | Admitting: *Deleted

## 2021-11-07 ENCOUNTER — Ambulatory Visit: Payer: BC Managed Care – PPO

## 2021-12-06 DIAGNOSIS — J9801 Acute bronchospasm: Secondary | ICD-10-CM | POA: Diagnosis not present

## 2021-12-06 DIAGNOSIS — K219 Gastro-esophageal reflux disease without esophagitis: Secondary | ICD-10-CM | POA: Diagnosis not present

## 2021-12-06 DIAGNOSIS — Z6836 Body mass index (BMI) 36.0-36.9, adult: Secondary | ICD-10-CM | POA: Diagnosis not present

## 2021-12-06 DIAGNOSIS — G894 Chronic pain syndrome: Secondary | ICD-10-CM | POA: Diagnosis not present

## 2021-12-06 DIAGNOSIS — J329 Chronic sinusitis, unspecified: Secondary | ICD-10-CM | POA: Diagnosis not present

## 2021-12-06 DIAGNOSIS — G4733 Obstructive sleep apnea (adult) (pediatric): Secondary | ICD-10-CM | POA: Diagnosis not present

## 2021-12-06 DIAGNOSIS — M47812 Spondylosis without myelopathy or radiculopathy, cervical region: Secondary | ICD-10-CM | POA: Diagnosis not present

## 2022-01-16 DIAGNOSIS — M47812 Spondylosis without myelopathy or radiculopathy, cervical region: Secondary | ICD-10-CM | POA: Diagnosis not present

## 2022-01-16 DIAGNOSIS — G894 Chronic pain syndrome: Secondary | ICD-10-CM | POA: Diagnosis not present

## 2022-02-20 DIAGNOSIS — M791 Myalgia, unspecified site: Secondary | ICD-10-CM | POA: Diagnosis not present

## 2022-02-20 DIAGNOSIS — Z20822 Contact with and (suspected) exposure to covid-19: Secondary | ICD-10-CM | POA: Diagnosis not present

## 2022-02-20 DIAGNOSIS — U071 COVID-19: Secondary | ICD-10-CM | POA: Diagnosis not present

## 2022-02-27 DIAGNOSIS — M47812 Spondylosis without myelopathy or radiculopathy, cervical region: Secondary | ICD-10-CM | POA: Diagnosis not present

## 2022-02-27 DIAGNOSIS — Z6837 Body mass index (BMI) 37.0-37.9, adult: Secondary | ICD-10-CM | POA: Diagnosis not present

## 2022-02-27 DIAGNOSIS — U071 COVID-19: Secondary | ICD-10-CM | POA: Diagnosis not present

## 2022-02-27 DIAGNOSIS — G894 Chronic pain syndrome: Secondary | ICD-10-CM | POA: Diagnosis not present

## 2022-02-27 DIAGNOSIS — J329 Chronic sinusitis, unspecified: Secondary | ICD-10-CM | POA: Diagnosis not present

## 2022-02-27 DIAGNOSIS — E6609 Other obesity due to excess calories: Secondary | ICD-10-CM | POA: Diagnosis not present

## 2022-02-27 DIAGNOSIS — J9801 Acute bronchospasm: Secondary | ICD-10-CM | POA: Diagnosis not present

## 2022-04-16 ENCOUNTER — Encounter: Payer: Self-pay | Admitting: *Deleted

## 2022-05-22 ENCOUNTER — Ambulatory Visit (HOSPITAL_COMMUNITY)
Admission: RE | Admit: 2022-05-22 | Discharge: 2022-05-22 | Disposition: A | Discharge status: Home / Self Care | Payer: BC Managed Care – PPO | Source: Ambulatory Visit | Attending: Internal Medicine | Admitting: Internal Medicine

## 2022-05-22 ENCOUNTER — Other Ambulatory Visit (HOSPITAL_COMMUNITY): Payer: Self-pay | Admitting: Internal Medicine

## 2022-05-22 DIAGNOSIS — R0600 Dyspnea, unspecified: Secondary | ICD-10-CM

## 2022-05-22 DIAGNOSIS — K219 Gastro-esophageal reflux disease without esophagitis: Secondary | ICD-10-CM | POA: Diagnosis not present

## 2022-05-22 DIAGNOSIS — Z125 Encounter for screening for malignant neoplasm of prostate: Secondary | ICD-10-CM | POA: Diagnosis not present

## 2022-05-22 DIAGNOSIS — R059 Cough, unspecified: Secondary | ICD-10-CM | POA: Diagnosis not present

## 2022-05-22 DIAGNOSIS — Z6837 Body mass index (BMI) 37.0-37.9, adult: Secondary | ICD-10-CM | POA: Diagnosis not present

## 2022-05-22 DIAGNOSIS — G4733 Obstructive sleep apnea (adult) (pediatric): Secondary | ICD-10-CM | POA: Diagnosis not present

## 2022-05-22 DIAGNOSIS — E6609 Other obesity due to excess calories: Secondary | ICD-10-CM | POA: Diagnosis not present

## 2022-05-22 DIAGNOSIS — Z0001 Encounter for general adult medical examination with abnormal findings: Secondary | ICD-10-CM | POA: Diagnosis not present

## 2022-05-22 DIAGNOSIS — Z9229 Personal history of other drug therapy: Secondary | ICD-10-CM | POA: Diagnosis not present

## 2022-05-22 DIAGNOSIS — G894 Chronic pain syndrome: Secondary | ICD-10-CM | POA: Diagnosis not present

## 2022-05-22 DIAGNOSIS — M47812 Spondylosis without myelopathy or radiculopathy, cervical region: Secondary | ICD-10-CM | POA: Diagnosis not present

## 2022-05-22 DIAGNOSIS — Z1331 Encounter for screening for depression: Secondary | ICD-10-CM | POA: Diagnosis not present

## 2022-05-30 ENCOUNTER — Other Ambulatory Visit (HOSPITAL_COMMUNITY): Payer: Self-pay | Admitting: Internal Medicine

## 2022-05-30 DIAGNOSIS — R748 Abnormal levels of other serum enzymes: Secondary | ICD-10-CM

## 2022-06-10 ENCOUNTER — Encounter (HOSPITAL_COMMUNITY): Payer: Self-pay

## 2022-06-10 ENCOUNTER — Ambulatory Visit (HOSPITAL_COMMUNITY): Payer: BC Managed Care – PPO | Attending: Internal Medicine

## 2022-07-03 DIAGNOSIS — G894 Chronic pain syndrome: Secondary | ICD-10-CM | POA: Diagnosis not present

## 2022-07-03 DIAGNOSIS — M47812 Spondylosis without myelopathy or radiculopathy, cervical region: Secondary | ICD-10-CM | POA: Diagnosis not present

## 2022-07-03 DIAGNOSIS — K219 Gastro-esophageal reflux disease without esophagitis: Secondary | ICD-10-CM | POA: Diagnosis not present

## 2022-07-03 DIAGNOSIS — J329 Chronic sinusitis, unspecified: Secondary | ICD-10-CM | POA: Diagnosis not present

## 2022-07-10 DIAGNOSIS — K529 Noninfective gastroenteritis and colitis, unspecified: Secondary | ICD-10-CM | POA: Diagnosis not present

## 2022-08-22 ENCOUNTER — Encounter (HOSPITAL_BASED_OUTPATIENT_CLINIC_OR_DEPARTMENT_OTHER): Payer: Self-pay

## 2022-08-22 DIAGNOSIS — R5383 Other fatigue: Secondary | ICD-10-CM

## 2022-08-22 DIAGNOSIS — R0683 Snoring: Secondary | ICD-10-CM

## 2022-08-22 DIAGNOSIS — G4710 Hypersomnia, unspecified: Secondary | ICD-10-CM

## 2022-09-03 DIAGNOSIS — M47812 Spondylosis without myelopathy or radiculopathy, cervical region: Secondary | ICD-10-CM | POA: Diagnosis not present

## 2022-09-03 DIAGNOSIS — G4733 Obstructive sleep apnea (adult) (pediatric): Secondary | ICD-10-CM | POA: Diagnosis not present

## 2022-09-03 DIAGNOSIS — G894 Chronic pain syndrome: Secondary | ICD-10-CM | POA: Diagnosis not present

## 2022-09-03 DIAGNOSIS — K219 Gastro-esophageal reflux disease without esophagitis: Secondary | ICD-10-CM | POA: Diagnosis not present

## 2022-09-09 DIAGNOSIS — R0981 Nasal congestion: Secondary | ICD-10-CM | POA: Diagnosis not present

## 2022-09-09 DIAGNOSIS — J3489 Other specified disorders of nose and nasal sinuses: Secondary | ICD-10-CM | POA: Diagnosis not present

## 2022-10-23 DIAGNOSIS — M47812 Spondylosis without myelopathy or radiculopathy, cervical region: Secondary | ICD-10-CM | POA: Diagnosis not present

## 2022-10-23 DIAGNOSIS — G4733 Obstructive sleep apnea (adult) (pediatric): Secondary | ICD-10-CM | POA: Diagnosis not present

## 2022-10-23 DIAGNOSIS — G894 Chronic pain syndrome: Secondary | ICD-10-CM | POA: Diagnosis not present

## 2022-10-23 DIAGNOSIS — Z6838 Body mass index (BMI) 38.0-38.9, adult: Secondary | ICD-10-CM | POA: Diagnosis not present

## 2022-12-08 IMAGING — US US ABDOMEN LIMITED
1 series · 14 of 25 positions shown · non-contrast
Comparison: None.

CLINICAL DATA: Three months of right upper quadrant pain with
reflux

EXAM:
ULTRASOUND ABDOMEN LIMITED RIGHT UPPER QUADRANT

[Series 1: us abdomen limited · 0.25mm/px · 14 of 53 slices shown]
[im 1/53]
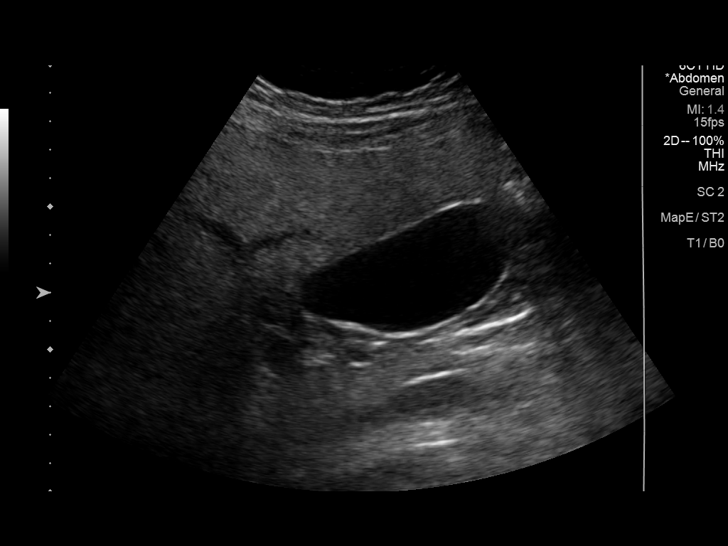
[im 5/53]
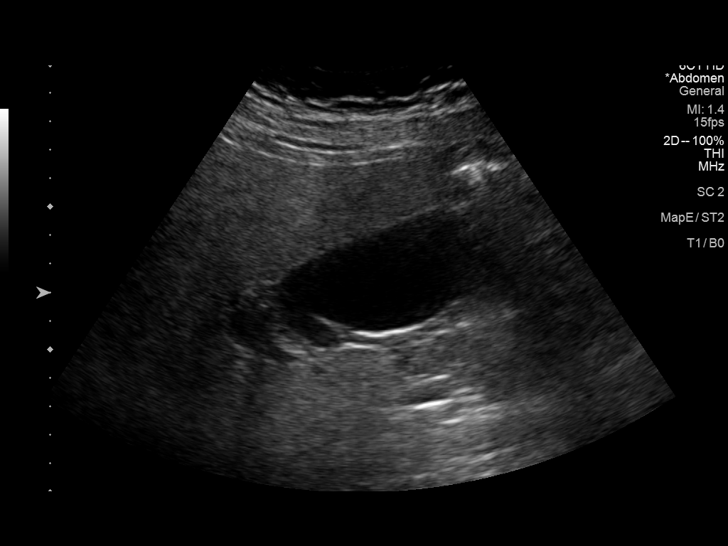
[im 9/53]
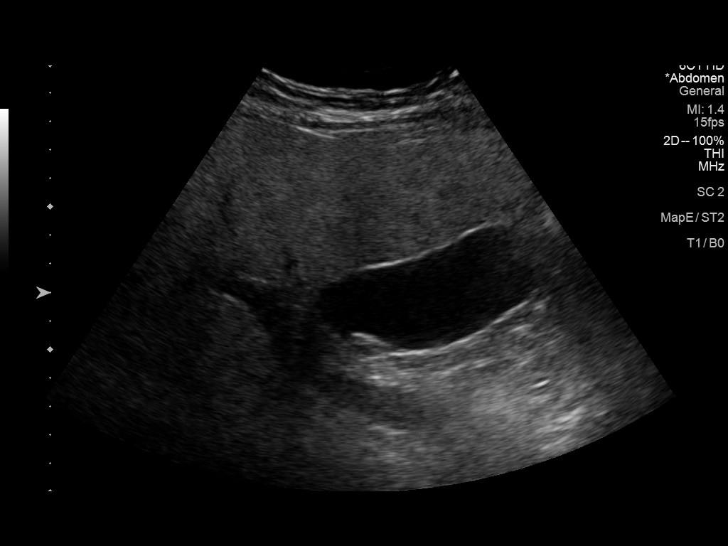
[im 14/53]
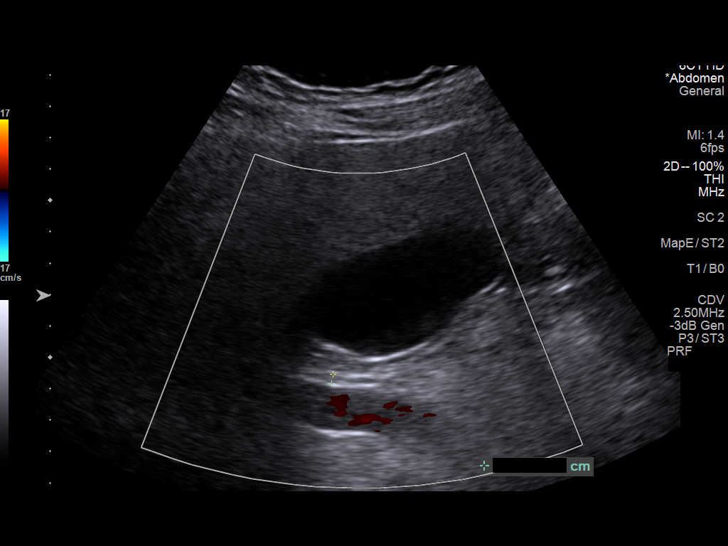
[im 18/53]
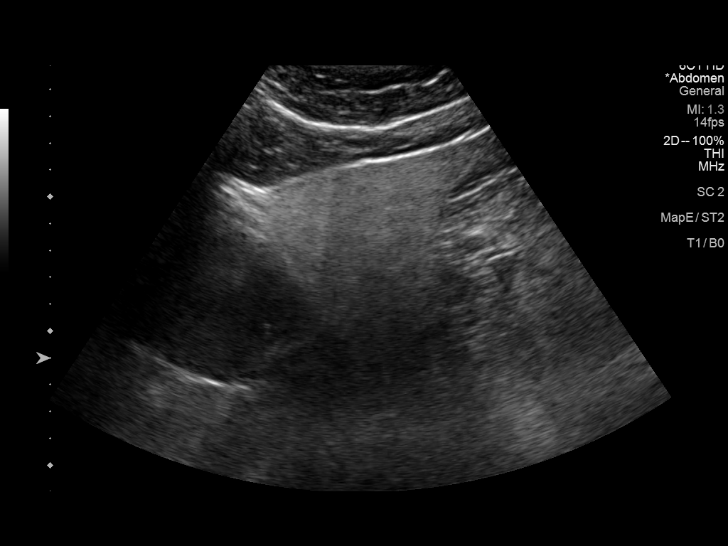
[im 20/53]
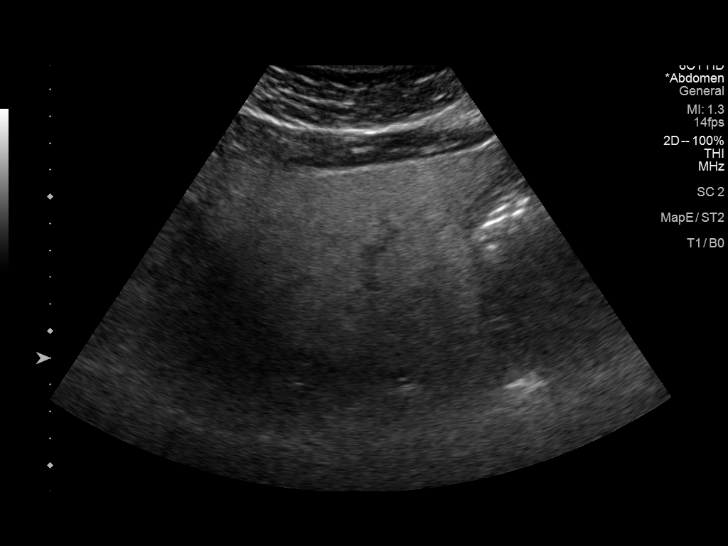
[im 24/53]
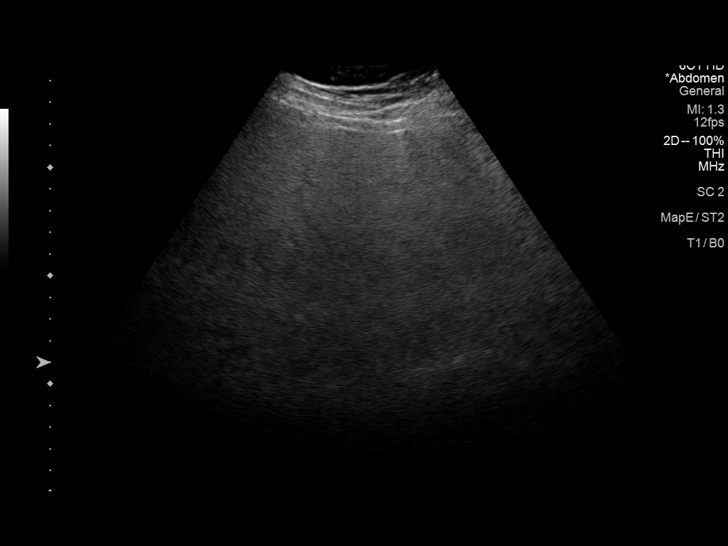
[im 29/53]
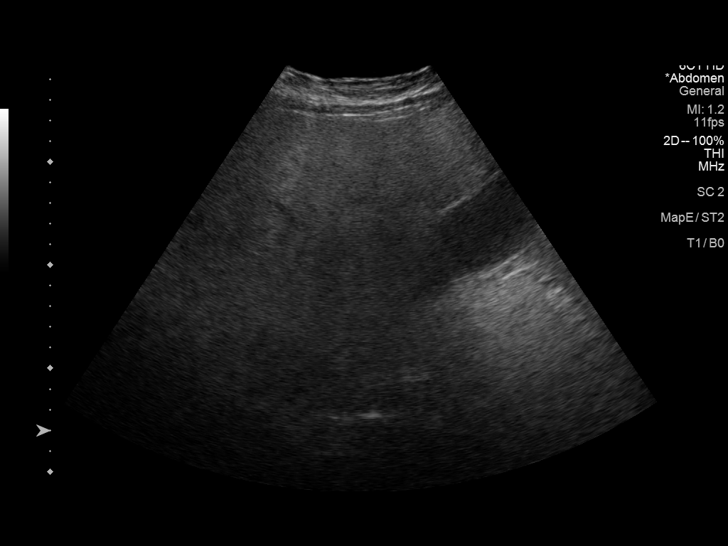
[im 33/53]
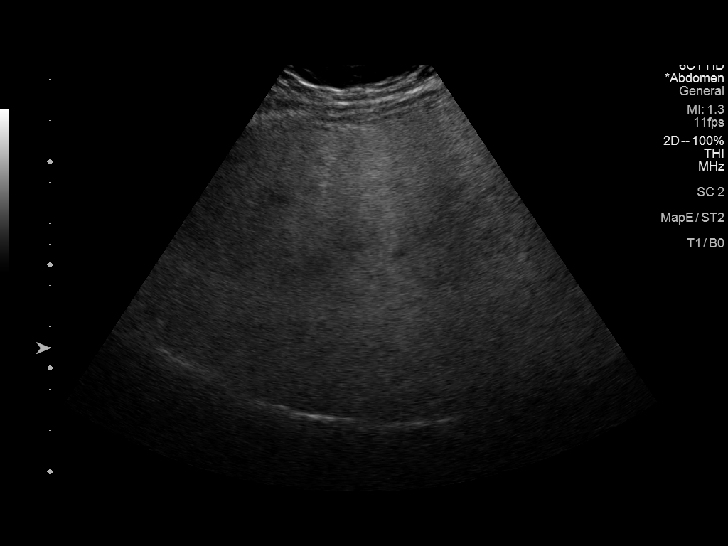
[im 35/53]
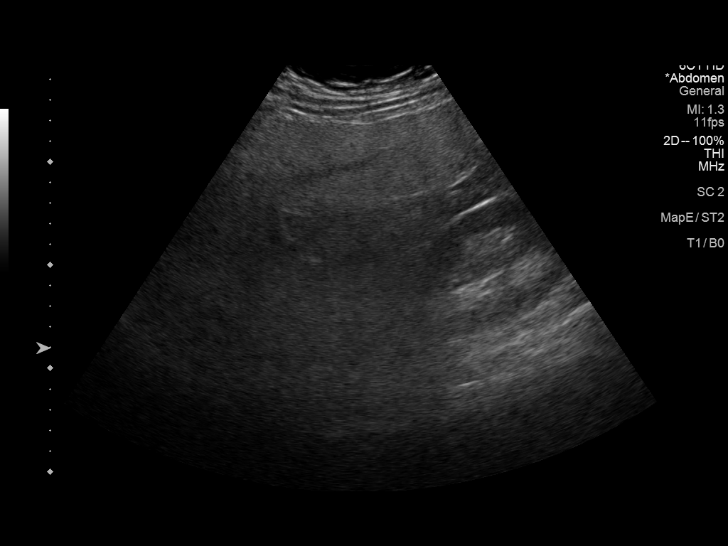
[im 40/53]
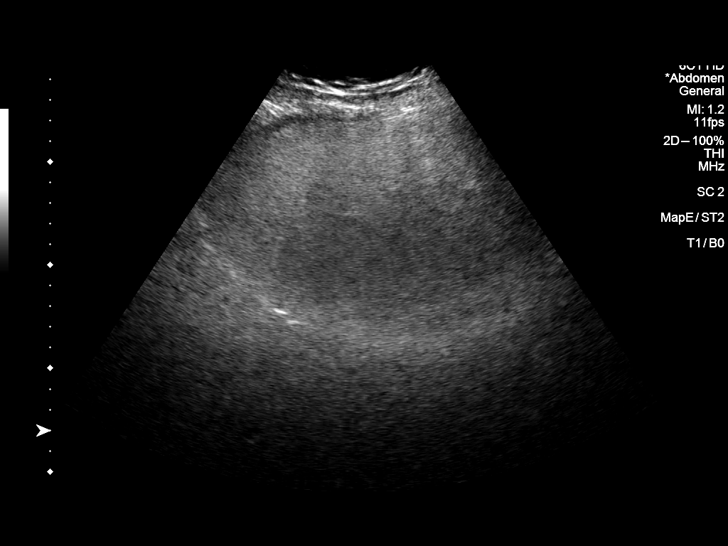
[im 44/53]
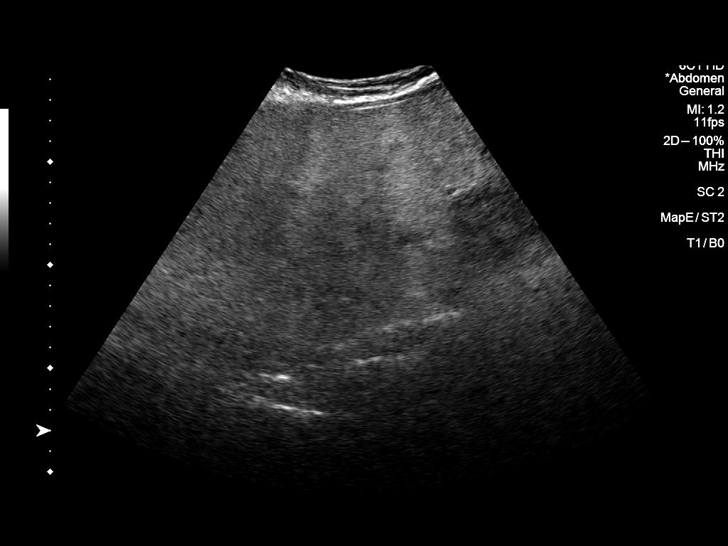
[im 48/53]
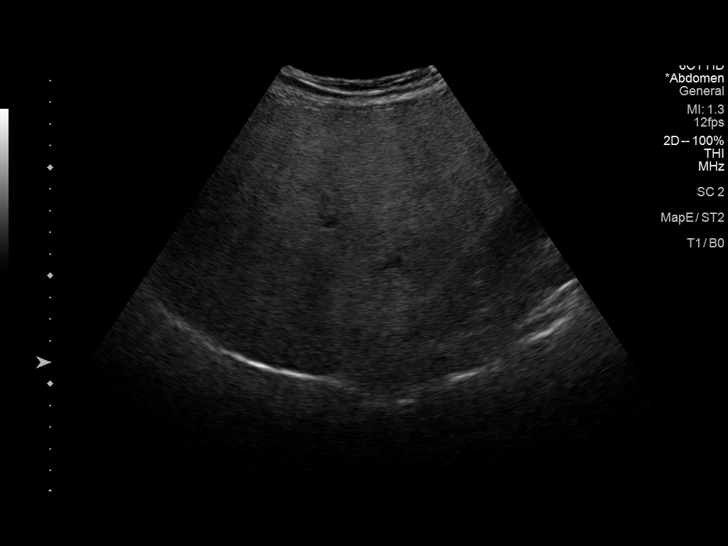
[im 53/53]
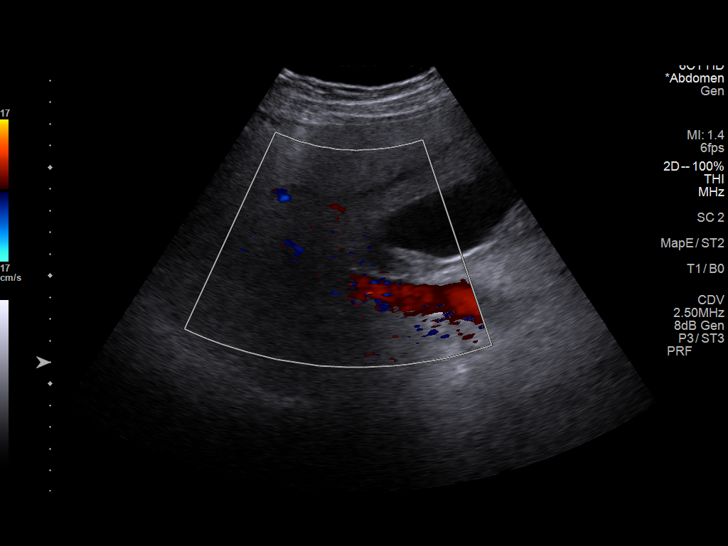

[14 of 25 positions shown; findings below may reference images not displayed]

FINDINGS: Gallbladder:

No gallstones or wall thickening visualized. No sonographic Murphy
sign noted by sonographer.

Common bile duct:

Diameter: 3 mm

Liver:

No focal lesion identified. Heterogeneously increased echogenicity
with coarsened hepatic echotexture. No discrete contour nodularity
visualized. Portal vein is patent on color Doppler imaging with
normal direction of blood flow towards the liver.

Other: None.
IMPRESSION: 1. Heterogeneously increased hepatic echogenicity with coarsened
hepatic echotexture. Findings are nonspecific but can be seen in the
setting of fatty infiltration or other diffuse hepatocellular
disease. No discrete contour nodularity visualized.
2. Unremarkable sonographic appearance of the gallbladder.

## 2022-12-11 DIAGNOSIS — G894 Chronic pain syndrome: Secondary | ICD-10-CM | POA: Diagnosis not present

## 2022-12-11 DIAGNOSIS — G4733 Obstructive sleep apnea (adult) (pediatric): Secondary | ICD-10-CM | POA: Diagnosis not present

## 2022-12-11 DIAGNOSIS — K219 Gastro-esophageal reflux disease without esophagitis: Secondary | ICD-10-CM | POA: Diagnosis not present

## 2022-12-11 DIAGNOSIS — J329 Chronic sinusitis, unspecified: Secondary | ICD-10-CM | POA: Diagnosis not present

## 2022-12-22 IMAGING — DX DG CHEST 2V
2 series · 2 of 2 positions shown · non-contrast
Comparison: 08/11/2020

CLINICAL DATA: Fever and fatigue for 1 week.

EXAM:
CHEST - 2 VIEW

[dg chest 2 view (1 of 2)]
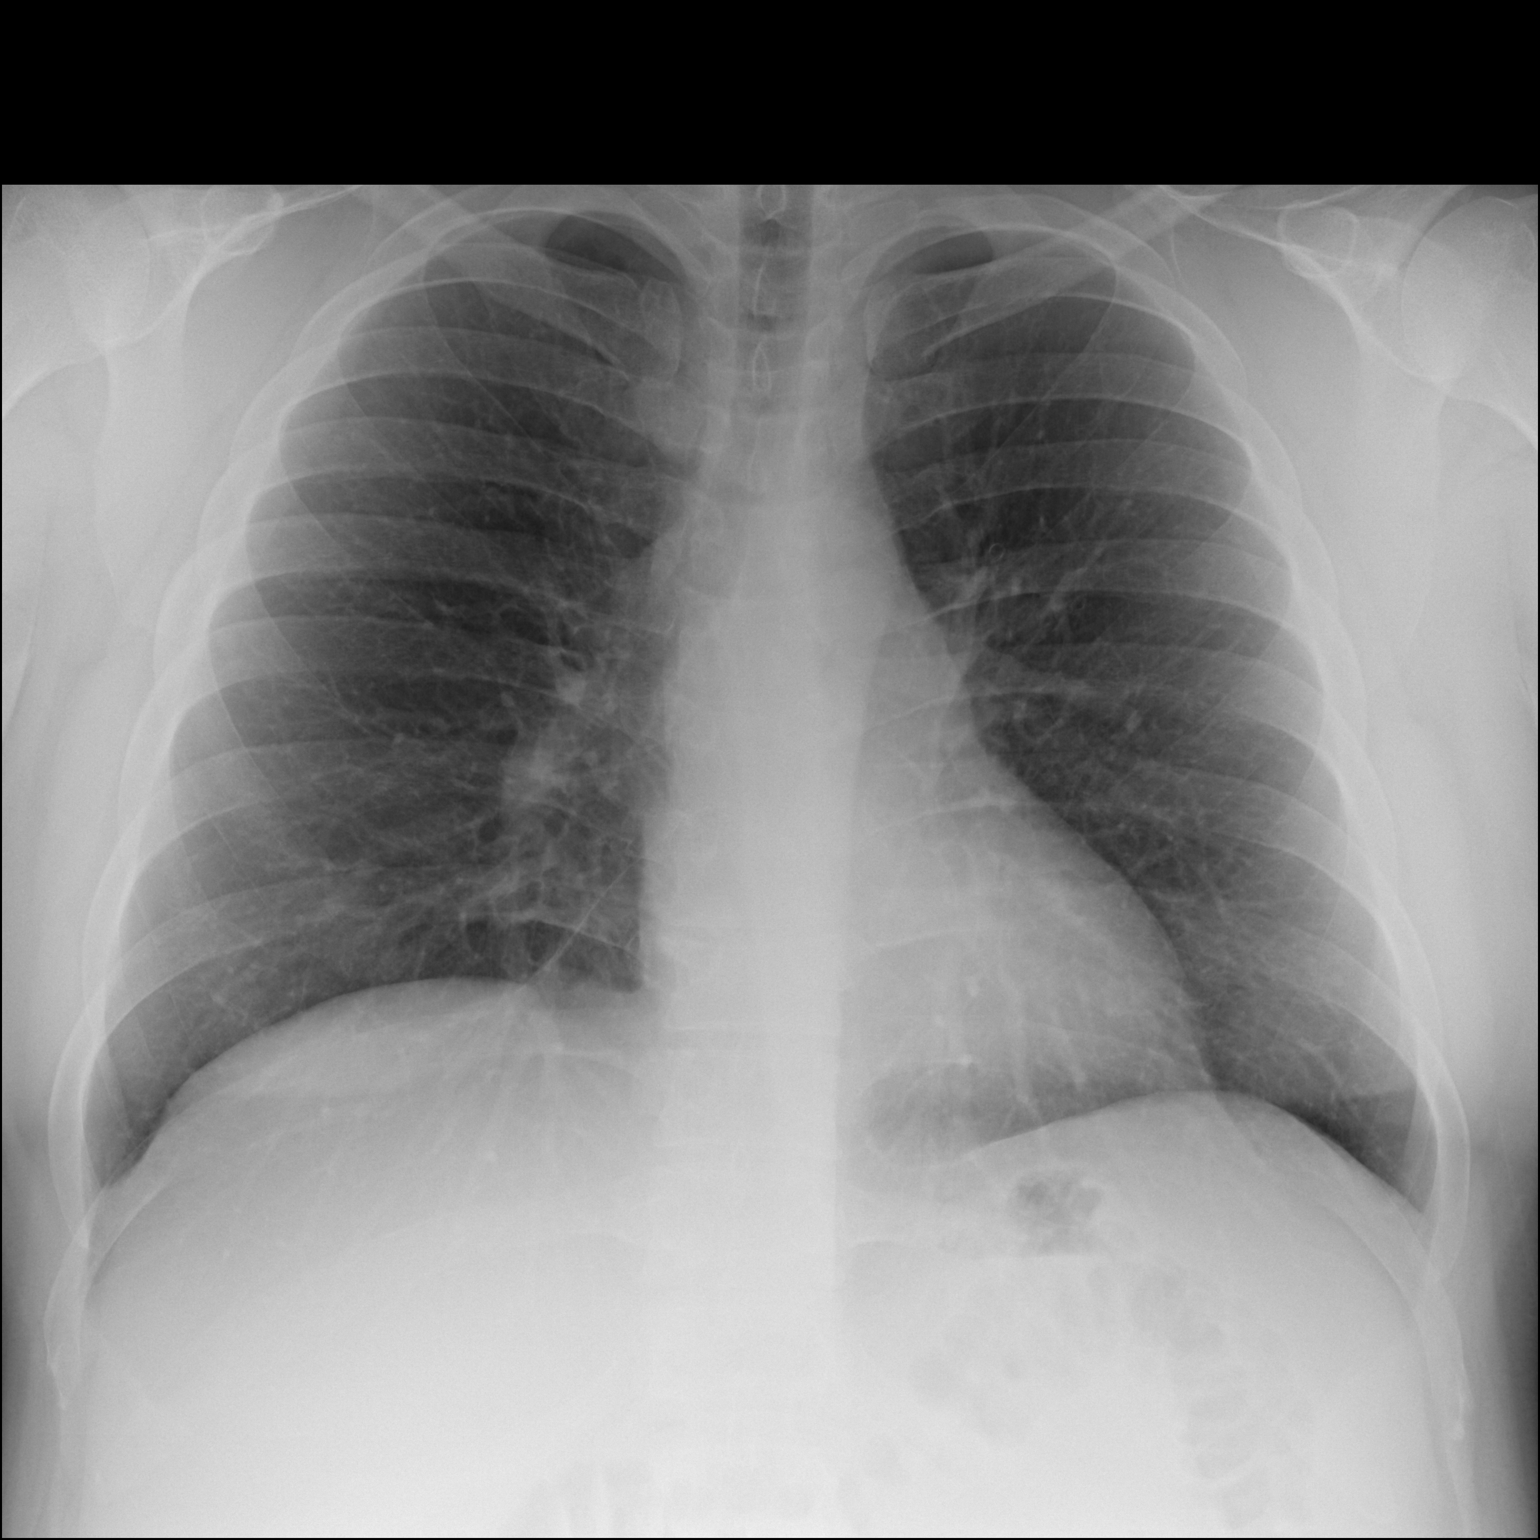

[dg chest 2 view (2 of 2)]
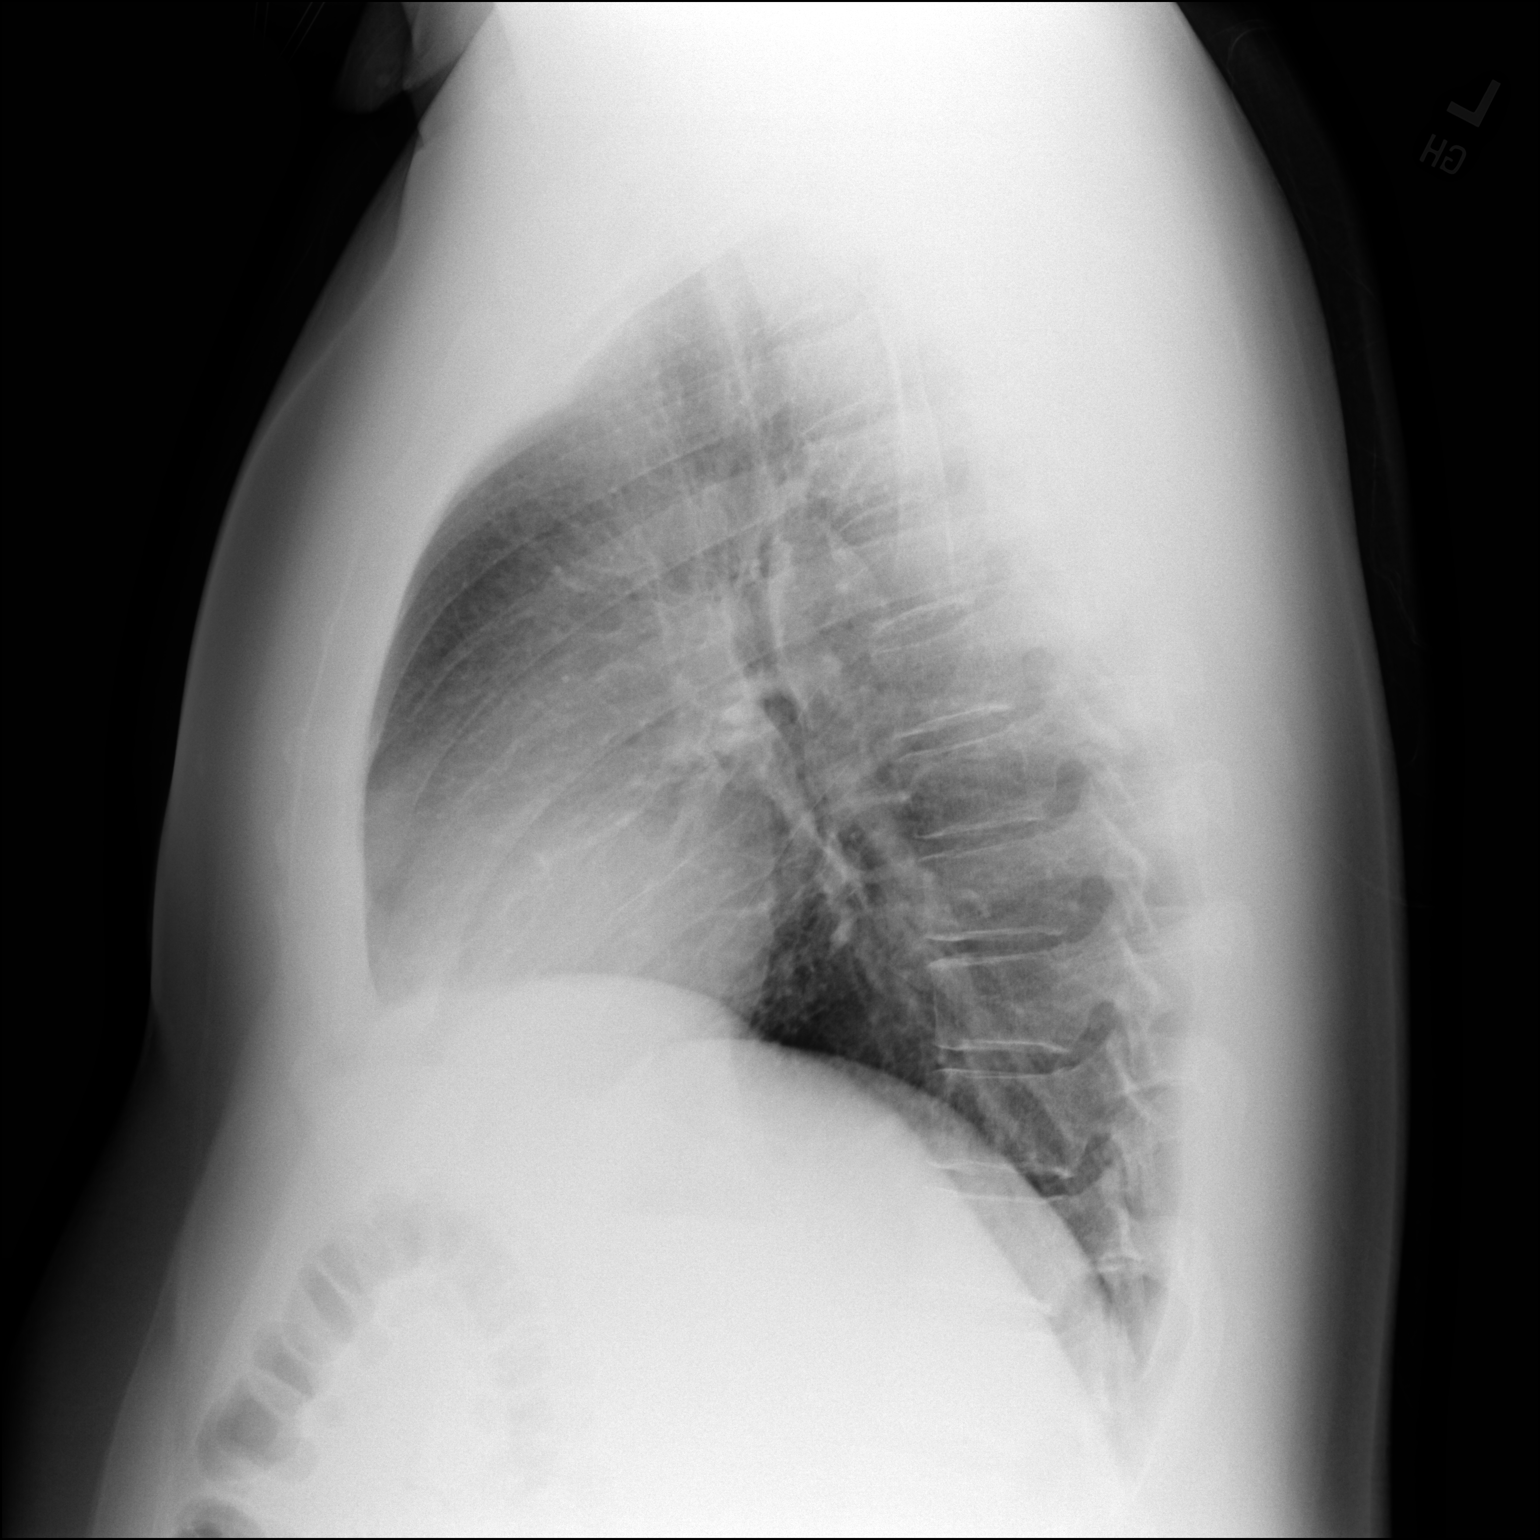

[2 of 2 positions shown; findings below may reference images not displayed]

FINDINGS: The heart size and mediastinal contours are within normal limits.
Both lungs are clear. The visualized skeletal structures are
unremarkable.
IMPRESSION: No active cardiopulmonary disease.

## 2023-02-17 DIAGNOSIS — G894 Chronic pain syndrome: Secondary | ICD-10-CM | POA: Diagnosis not present

## 2023-04-21 DIAGNOSIS — G894 Chronic pain syndrome: Secondary | ICD-10-CM | POA: Diagnosis not present

## 2023-04-21 DIAGNOSIS — G4733 Obstructive sleep apnea (adult) (pediatric): Secondary | ICD-10-CM | POA: Diagnosis not present

## 2023-04-21 DIAGNOSIS — Z6838 Body mass index (BMI) 38.0-38.9, adult: Secondary | ICD-10-CM | POA: Diagnosis not present

## 2023-04-21 DIAGNOSIS — E6609 Other obesity due to excess calories: Secondary | ICD-10-CM | POA: Diagnosis not present

## 2023-04-21 DIAGNOSIS — M47812 Spondylosis without myelopathy or radiculopathy, cervical region: Secondary | ICD-10-CM | POA: Diagnosis not present

## 2023-05-16 DIAGNOSIS — J069 Acute upper respiratory infection, unspecified: Secondary | ICD-10-CM | POA: Diagnosis not present

## 2023-05-16 DIAGNOSIS — Z20822 Contact with and (suspected) exposure to covid-19: Secondary | ICD-10-CM | POA: Diagnosis not present

## 2023-05-16 DIAGNOSIS — R519 Headache, unspecified: Secondary | ICD-10-CM | POA: Diagnosis not present

## 2023-06-05 ENCOUNTER — Encounter (HOSPITAL_BASED_OUTPATIENT_CLINIC_OR_DEPARTMENT_OTHER): Payer: Self-pay | Admitting: Internal Medicine

## 2023-06-05 DIAGNOSIS — R0683 Snoring: Secondary | ICD-10-CM

## 2023-06-05 DIAGNOSIS — G4710 Hypersomnia, unspecified: Secondary | ICD-10-CM

## 2023-06-19 DIAGNOSIS — J111 Influenza due to unidentified influenza virus with other respiratory manifestations: Secondary | ICD-10-CM | POA: Diagnosis not present

## 2023-06-19 DIAGNOSIS — R051 Acute cough: Secondary | ICD-10-CM | POA: Diagnosis not present

## 2023-06-19 DIAGNOSIS — Z20822 Contact with and (suspected) exposure to covid-19: Secondary | ICD-10-CM | POA: Diagnosis not present

## 2023-06-24 DIAGNOSIS — G4733 Obstructive sleep apnea (adult) (pediatric): Secondary | ICD-10-CM | POA: Diagnosis not present

## 2023-06-24 DIAGNOSIS — J209 Acute bronchitis, unspecified: Secondary | ICD-10-CM | POA: Diagnosis not present

## 2023-06-24 DIAGNOSIS — K219 Gastro-esophageal reflux disease without esophagitis: Secondary | ICD-10-CM | POA: Diagnosis not present

## 2023-06-24 DIAGNOSIS — Z6838 Body mass index (BMI) 38.0-38.9, adult: Secondary | ICD-10-CM | POA: Diagnosis not present

## 2023-06-24 DIAGNOSIS — G894 Chronic pain syndrome: Secondary | ICD-10-CM | POA: Diagnosis not present

## 2023-06-24 DIAGNOSIS — J101 Influenza due to other identified influenza virus with other respiratory manifestations: Secondary | ICD-10-CM | POA: Diagnosis not present

## 2023-06-24 DIAGNOSIS — M47812 Spondylosis without myelopathy or radiculopathy, cervical region: Secondary | ICD-10-CM | POA: Diagnosis not present

## 2023-07-03 DIAGNOSIS — G894 Chronic pain syndrome: Secondary | ICD-10-CM | POA: Diagnosis not present

## 2023-07-03 DIAGNOSIS — J101 Influenza due to other identified influenza virus with other respiratory manifestations: Secondary | ICD-10-CM | POA: Diagnosis not present

## 2023-07-03 DIAGNOSIS — J209 Acute bronchitis, unspecified: Secondary | ICD-10-CM | POA: Diagnosis not present

## 2023-07-09 DIAGNOSIS — J189 Pneumonia, unspecified organism: Secondary | ICD-10-CM | POA: Diagnosis not present

## 2023-07-15 DIAGNOSIS — J209 Acute bronchitis, unspecified: Secondary | ICD-10-CM | POA: Diagnosis not present

## 2023-07-15 DIAGNOSIS — G894 Chronic pain syndrome: Secondary | ICD-10-CM | POA: Diagnosis not present

## 2023-07-15 DIAGNOSIS — J101 Influenza due to other identified influenza virus with other respiratory manifestations: Secondary | ICD-10-CM | POA: Diagnosis not present

## 2023-07-18 ENCOUNTER — Ambulatory Visit (HOSPITAL_COMMUNITY)
Admission: RE | Admit: 2023-07-18 | Discharge: 2023-07-18 | Disposition: A | Discharge status: Home / Self Care | Source: Ambulatory Visit | Attending: Internal Medicine | Admitting: Internal Medicine

## 2023-07-18 ENCOUNTER — Other Ambulatory Visit (HOSPITAL_COMMUNITY): Payer: Self-pay | Admitting: Internal Medicine

## 2023-07-18 DIAGNOSIS — R059 Cough, unspecified: Secondary | ICD-10-CM | POA: Diagnosis not present

## 2023-07-18 DIAGNOSIS — R052 Subacute cough: Secondary | ICD-10-CM

## 2023-07-18 DIAGNOSIS — R079 Chest pain, unspecified: Secondary | ICD-10-CM | POA: Diagnosis not present

## 2023-07-31 ENCOUNTER — Encounter: Payer: Self-pay | Admitting: Nurse Practitioner

## 2023-07-31 ENCOUNTER — Ambulatory Visit: Payer: BC Managed Care – PPO | Admitting: Nurse Practitioner

## 2023-07-31 VITALS — BP 134/82 | HR 96 | Ht 72.0 in | Wt 284.0 lb

## 2023-07-31 DIAGNOSIS — G4733 Obstructive sleep apnea (adult) (pediatric): Secondary | ICD-10-CM

## 2023-07-31 DIAGNOSIS — R0683 Snoring: Secondary | ICD-10-CM

## 2023-07-31 DIAGNOSIS — E669 Obesity, unspecified: Secondary | ICD-10-CM | POA: Insufficient documentation

## 2023-07-31 DIAGNOSIS — G4719 Other hypersomnia: Secondary | ICD-10-CM

## 2023-07-31 NOTE — Assessment & Plan Note (Signed)
BMI 38. Healthy weight loss encouraged.  

## 2023-07-31 NOTE — Progress Notes (Signed)
 @Patient  ID: Willie Cook, male    DOB: [birth year], 43 y.o.   MRN: [id number]  Chief Complaint  Patient presents with   Sleep Consult    Referring provider: Elfredia Nevins, MD  HPI: 43 year old male, former smoker referred for sleep consult. Past medical history significant for allergies, GERD, anxiety, sleep apnea.   TEST/EVENTS:   07/31/2023: Today - sleep consult Discussed the use of AI scribe software for clinical note transcription with the patient, who gave verbal consent to proceed.  History of Present Illness   Willie Cook is a 43 year old male with sleep apnea.  He has a history of severe sleep apnea confirmed by a home sleep study many years ago. He attempted CPAP therapy but found it intolerable due to excessive pressure, causing sensations of choking and inability to breathe. He discontinued CPAP after approximately two weeks. He tried using a travel BiPAP machine that was his father's, which he found more comfortable and effective, allowing him to sleep well. He's never had in lab titration study. Not currently on therapy.   He experiences snoring, daytime fatigue, and difficulty staying asleep. He often wakes up in the middle of the night, sometimes choking and gasping for air, and finds it challenging to return to sleep. He does not experience morning headaches but reports a sore throat upon waking and significant fatigue, making it difficult to get up in the morning. He does not take any medication for sleep and consumes minimal caffeine. 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 He notes that he sleeps better in a recliner with his feet elevated and snores less in this position. Denies any drowsy driving, sleep parasomnias/paralysis.  His fiance and family members have observed that he stops breathing during sleep. He has tried various settings on the CPAP in past without  success.   Goes to bed around 11 PM.  Falls asleep within 10 to 15 minutes on average.  Wakes 3 times or more most nights.  Gets up around 6 AM for work.  Usually sleeps in until 10 or 11 AM when he is off.  Weight is up 30 pounds over the last 2 years.  Last sleep study not available. No supplemental oxygen use.  No significant cardiac history, diabetes or history of stroke.  He is a former smoker.  Quit 10 years ago.  Drinks a few beers a week.  Lives with his fiance.  Works at [employer].  He is getting married in May.  Family history of allergies, asthma, heart disease, sleep apnea.  [place]worth 12      No Known Allergies   There is no immunization history on file for this patient.  Past Medical History:  Diagnosis Date   Renal disorder    kidney stones    Tobacco History: Social History   Tobacco Use  Smoking Status Former  Smokeless Tobacco Never   Counseling given: Not Answered   Outpatient Medications Prior to Visit  Medication Sig Dispense Refill   albuterol (VENTOLIN HFA) 108 (90 Base) MCG/ACT inhaler Inhale 2 puffs into the lungs every 4 (four) hours.     ALPRAZolam (XANAX) 0.5 MG tablet Take 1 tablet by mouth 4 (four) times daily as needed.  2   famotidine (PEPCID) 40 MG tablet Take 40 mg by mouth daily.     HYDROcodone-acetaminophen (NORCO) 10-325 MG tablet Take 1 tablet by  mouth every 4 (four) hours as needed.     montelukast (SINGULAIR) 10 MG tablet Take 10 mg by mouth daily.     oxyCODONE-acetaminophen (PERCOCET/ROXICET) 5-325 MG tablet Take 2 tablets by mouth every 4 (four) hours as needed for severe pain. 15 tablet 0   pantoprazole (PROTONIX) 40 MG tablet Take 40 mg by mouth daily.     azithromycin (ZITHROMAX) 250 MG tablet Take 1 tablet (250 mg total) by mouth daily. Take first 2 tablets together, then 1 every day until finished. 6 tablet 0   ondansetron (ZOFRAN ODT) 4 MG disintegrating tablet Take 1 tablet (4 mg total) by mouth every 8 (eight) hours as  needed for nausea or vomiting. 10 tablet 0   promethazine-codeine (PHENERGAN WITH CODEINE) 6.25-10 MG/5ML syrup Take 10 mLs by mouth every 6 (six) hours as needed for cough. 120 mL 0   tamsulosin (FLOMAX) 0.4 MG CAPS capsule Take 1 capsule (0.4 mg total) by mouth daily. 10 capsule 0   No facility-administered medications prior to visit.     Review of Systems:   Constitutional: No night sweats, fevers, chills, or lassitude. +weight gain, fatigue  HEENT: No headaches, difficulty swallowing, tooth/dental problems. +AM sore throat, occasional allergy symptoms  CV:  No chest pain, orthopnea, PND, swelling in lower extremities, anasarca, dizziness, palpitations, syncope Resp: snoring, witnessed apneas; baseline shortness of breath with exertion. No excess mucus or change in color of mucus. No productive or non-productive. No hemoptysis. No wheezing.  No chest wall deformity GI:  +heartburn, indigestion (stable). No abdominal pain, nausea, vomiting, diarrhea, change in bowel habits, loss of appetite, bloody stools.  GU: No nocturia  Skin: No rash, lesions, ulcerations MSK:  +chronic joint pains Neuro: No dizziness or lightheadedness.  Psych: No depression or anxiety. Mood stable. +sleep disturbance     Physical Exam:  BP 134/82 (BP Location: Left Arm, Cuff Size: Normal)   Pulse 96   Ht 6' (1.829 m)   Wt 284 lb (128.8 kg)   SpO2 96%   BMI 38.52 kg/m   GEN: Pleasant, interactive, well-appearing; obese; in no acute distress HEENT:  Normocephalic and atraumatic. PERRLA. Sclera white. Nasal turbinates pink, moist and patent bilaterally. No rhinorrhea present. Oropharynx pink and moist, without exudate or edema. No lesions, ulcerations, or postnasal drip. Mallampati III NECK:  Supple w/ fair ROM. Thyroid symmetrical with no goiter or nodules palpated. No lymphadenopathy.   CV: RRR, no m/r/g, no peripheral edema. Pulses intact, +2 bilaterally. No cyanosis, pallor or clubbing. PULMONARY:   Unlabored, regular breathing. Clear bilaterally A&P w/o wheezes/rales/rhonchi. No accessory muscle use.  GI: BS present and normoactive. Soft, non-tender to palpation. No organomegaly or masses detected.  MSK: No erythema, warmth or tenderness. Cap refil <2 sec all extrem. N Neuro: A/Ox3. No focal deficits noted.   Skin: Warm, no lesions or rashe Psych: Normal affect and behavior. Judgement and thought content appropriate.     Lab Results:  CBC No results found for: "WBC", "RBC", "HGB", "HCT", "PLT", "MCV", "MCH", "MCHC", "RDW", "LYMPHSABS", "MONOABS", "EOSABS", "BASOSABS"  BMET No results found for: "NA", "K", "CL", "CO2", "GLUCOSE", "BUN", "CREATININE", "CALCIUM", "GFRNONAA", "GFRAA"  BNP No results found for: "BNP"   Imaging:  DG Chest 2 View Result Date: 07/22/2023 CLINICAL DATA:  Cough for 4 weeks.  Left-sided chest pain. EXAM: CHEST - 2 VIEW COMPARISON:  05/22/2022 FINDINGS: The heart size and mediastinal contours are within normal limits. Both lungs are clear. The visualized skeletal structures are unremarkable. IMPRESSION: No active  cardiopulmonary disease. Electronically Signed   By: Danae Orleans M.D.   On: 07/22/2023 22:02    Administration History     None           No data to display          No results found for: "NITRICOXIDE"      Assessment & Plan:   OSA (obstructive sleep apnea) Hx of severe obstructive sleep apnea per his report. He is unable to tolerate CPAP. Symptoms include snoring, daytime fatigue, and witnessed apneas. Improved results with family member's BiPAP suggest potential benefit from this therapy.   BMI 38.Given this,  I am concerned he still has sleep disordered breathing with obstructive sleep apnea. He will need sleep study for further evaluation with split night titration study. Instructed to transition to BiPAP if poorly controlled/intolerant on CPAP.    - discussed how weight can impact sleep and risk for sleep disordered  breathing - discussed options to assist with weight loss: combination of diet modification, cardiovascular and strength training exercises   - had an extensive discussion regarding the adverse health consequences related to untreated sleep disordered breathing - specifically discussed the risks for hypertension, coronary artery disease, cardiac dysrhythmias, cerebrovascular disease, and diabetes - lifestyle modification discussed   - discussed how sleep disruption can increase risk of accidents, particularly when driving - safe driving practices were discussed  Patient Instructions  Given your symptoms and history, I am concerned that you still have sleep disordered breathing with sleep apnea. Since you've had difficulties in the past tolerating CPAP, you will need an in lab sleep study for further evaluation. Someone will contact you to schedule this. Let the sleep tech know about your difficulties with CPAP in the past and that BiPAP has been better tolerated.   We discussed how untreated sleep apnea puts an individual at risk for cardiac arrhthymias, pulm HTN, DM, stroke and increases their risk for daytime accidents. We also briefly reviewed treatment options including weight loss, side sleeping position, oral appliance, CPAP therapy or referral to ENT for possible surgical options  Use caution when driving and pull over if you become sleepy.  Follow up 5/15 at 4 pm with Katie Kyrstin Campillo,NP to go over sleep study results, or sooner, if needed.       Obesity (BMI 30-39.9) BMI 38. Healthy weight loss encouraged   Advised if symptoms do not improve or worsen, to please contact office for sooner follow up or seek emergency care.   I spent 45 minutes of dedicated to the care of this patient on the date of this encounter to include pre-visit review of records, face-to-face time with the patient discussing conditions above, post visit ordering of testing, clinical documentation with the  electronic health record, making appropriate referrals as documented, and communicating necessary findings to members of the patients care team.  Noemi Chapel, NP 07/31/2023  Pt aware and understands NP's role.

## 2023-07-31 NOTE — Assessment & Plan Note (Signed)
 Hx of severe obstructive sleep apnea per his report. He is unable to tolerate CPAP. Symptoms include snoring, daytime fatigue, and witnessed apneas. Improved results with family member's BiPAP suggest potential benefit from this therapy.   BMI 38.Given this,  I am concerned he still has sleep disordered breathing with obstructive sleep apnea. He will need sleep study for further evaluation with split night titration study. Instructed to transition to BiPAP if poorly controlled/intolerant on CPAP.    - discussed how weight can impact sleep and risk for sleep disordered breathing - discussed options to assist with weight loss: combination of diet modification, cardiovascular and strength training exercises   - had an extensive discussion regarding the adverse health consequences related to untreated sleep disordered breathing - specifically discussed the risks for hypertension, coronary artery disease, cardiac dysrhythmias, cerebrovascular disease, and diabetes - lifestyle modification discussed   - discussed how sleep disruption can increase risk of accidents, particularly when driving - safe driving practices were discussed  Patient Instructions  Given your symptoms and history, I am concerned that you still have sleep disordered breathing with sleep apnea. Since you've had difficulties in the past tolerating CPAP, you will need an in lab sleep study for further evaluation. Someone will contact you to schedule this. Let the sleep tech know about your difficulties with CPAP in the past and that BiPAP has been better tolerated.   We discussed how untreated sleep apnea puts an individual at risk for cardiac arrhthymias, pulm HTN, DM, stroke and increases their risk for daytime accidents. We also briefly reviewed treatment options including weight loss, side sleeping position, oral appliance, CPAP therapy or referral to ENT for possible surgical options  Use caution when driving and pull over if you  become sleepy.  Follow up 5/15 at 4 pm with Katie Nakesha Ebrahim,NP to go over sleep study results, or sooner, if needed.

## 2023-07-31 NOTE — Patient Instructions (Addendum)
 Given your symptoms and history, I am concerned that you still have sleep disordered breathing with sleep apnea. Since you've had difficulties in the past tolerating CPAP, you will need an in lab sleep study for further evaluation. Someone will contact you to schedule this. Let the sleep tech know about your difficulties with CPAP in the past and that BiPAP has been better tolerated.   We discussed how untreated sleep apnea puts an individual at risk for cardiac arrhthymias, pulm HTN, DM, stroke and increases their risk for daytime accidents. We also briefly reviewed treatment options including weight loss, side sleeping position, oral appliance, CPAP therapy or referral to ENT for possible surgical options  Use caution when driving and pull over if you become sleepy.  Follow up 5/15 at 4 pm with Willie Lesieli Bresee,NP to go over sleep study results, or sooner, if needed.

## 2023-08-22 DIAGNOSIS — R1032 Left lower quadrant pain: Secondary | ICD-10-CM | POA: Diagnosis not present

## 2023-08-22 DIAGNOSIS — R451 Restlessness and agitation: Secondary | ICD-10-CM | POA: Diagnosis not present

## 2023-08-22 DIAGNOSIS — N200 Calculus of kidney: Secondary | ICD-10-CM | POA: Diagnosis not present

## 2023-08-22 DIAGNOSIS — N50812 Left testicular pain: Secondary | ICD-10-CM | POA: Diagnosis not present

## 2023-08-22 DIAGNOSIS — K760 Fatty (change of) liver, not elsewhere classified: Secondary | ICD-10-CM | POA: Diagnosis not present

## 2023-08-22 DIAGNOSIS — Z884 Allergy status to anesthetic agent status: Secondary | ICD-10-CM | POA: Diagnosis not present

## 2023-08-22 DIAGNOSIS — K8020 Calculus of gallbladder without cholecystitis without obstruction: Secondary | ICD-10-CM | POA: Diagnosis not present

## 2023-08-22 DIAGNOSIS — N132 Hydronephrosis with renal and ureteral calculous obstruction: Secondary | ICD-10-CM | POA: Diagnosis not present

## 2023-08-27 DIAGNOSIS — K760 Fatty (change of) liver, not elsewhere classified: Secondary | ICD-10-CM | POA: Diagnosis not present

## 2023-08-27 DIAGNOSIS — Z884 Allergy status to anesthetic agent status: Secondary | ICD-10-CM | POA: Diagnosis not present

## 2023-08-27 DIAGNOSIS — G4733 Obstructive sleep apnea (adult) (pediatric): Secondary | ICD-10-CM | POA: Diagnosis not present

## 2023-08-27 DIAGNOSIS — N23 Unspecified renal colic: Secondary | ICD-10-CM | POA: Diagnosis not present

## 2023-08-27 DIAGNOSIS — E6609 Other obesity due to excess calories: Secondary | ICD-10-CM | POA: Diagnosis not present

## 2023-08-27 DIAGNOSIS — G894 Chronic pain syndrome: Secondary | ICD-10-CM | POA: Diagnosis not present

## 2023-08-27 DIAGNOSIS — N200 Calculus of kidney: Secondary | ICD-10-CM | POA: Diagnosis not present

## 2023-08-27 DIAGNOSIS — M47812 Spondylosis without myelopathy or radiculopathy, cervical region: Secondary | ICD-10-CM | POA: Diagnosis not present

## 2023-08-27 DIAGNOSIS — Z6837 Body mass index (BMI) 37.0-37.9, adult: Secondary | ICD-10-CM | POA: Diagnosis not present

## 2023-08-27 DIAGNOSIS — Z79899 Other long term (current) drug therapy: Secondary | ICD-10-CM | POA: Diagnosis not present

## 2023-09-21 ENCOUNTER — Encounter (HOSPITAL_BASED_OUTPATIENT_CLINIC_OR_DEPARTMENT_OTHER): Admitting: Pulmonary Disease

## 2023-09-25 ENCOUNTER — Ambulatory Visit: Admitting: Nurse Practitioner

## 2023-10-02 ENCOUNTER — Ambulatory Visit: Admitting: Urology

## 2023-11-03 DIAGNOSIS — G4733 Obstructive sleep apnea (adult) (pediatric): Secondary | ICD-10-CM | POA: Diagnosis not present

## 2023-11-03 DIAGNOSIS — M47812 Spondylosis without myelopathy or radiculopathy, cervical region: Secondary | ICD-10-CM | POA: Diagnosis not present

## 2023-11-03 DIAGNOSIS — G894 Chronic pain syndrome: Secondary | ICD-10-CM | POA: Diagnosis not present

## 2023-11-05 DIAGNOSIS — N200 Calculus of kidney: Secondary | ICD-10-CM | POA: Insufficient documentation

## 2023-11-05 NOTE — Progress Notes (Deleted)
 Name: ALEKSI BRUMMET DOB: 1980/05/23 MRN: [id number]  History of Present Illness: Mr. Sabic is a 43 y.o. male who presents today as a new patient at [facility]. All available relevant medical records have been reviewed.   He reports concern of kidney stone(s).   Recent history: > 08/22/2023: Seen in ER for left flank pain. CMP with normal renal function (GFR >60; creatinine 0.94). CBC with leukocytosis (WBC 13.9). UA showed 2 WBC/hpf, 0 RBC/hpf, small bacteria. Urine culture negative. CT showed a 4 mm stone in the left distal ureter with mild left hydroureteronephrosis. Multiple additional punctate bilateral nonobstructing renal stones.  Prescribed Flomax , Zofran , and Oxycodone  5 mg (#20).  > 08/27/2023: Returned to ER for left flank pain. CMP with elevated creatinine (1.66); GFR 52. CBC with leukocytosis (WBC 14.1). UA showed 0 WBC/hpf, 10 RBC/hpf, no bacteria. KUB showed Tiny calcification overlying the left aspect of the urinary bladder. Based on prior CT, this could be a distal left ureteral stone or a stone within the urinary bladder. Toradol  prescribed for renal colic.  Today: He {Actions; denies-reports:120008} flank pain or abdominal pain. He {Actions; denies-reports:120008} fevers, nausea, or vomiting.  He {Actions; denies-reports:120008} increased urinary urgency, frequency, nocturia, dysuria, gross hematuria, hesitancy, straining to void, or sensations of incomplete emptying.  ***repeat KUB to confirm stone passage, then f/u in 6 months  Medications: Current Outpatient Medications  Medication Sig Dispense Refill   albuterol (VENTOLIN HFA) 108 (90 Base) MCG/ACT inhaler Inhale 2 puffs into the lungs every 4 (four) hours.     ALPRAZolam (XANAX) 0.5 MG tablet Take 1 tablet by mouth 4 (four) times daily as needed.  2   famotidine (PEPCID) 40 MG tablet Take 40 mg by mouth daily.     HYDROcodone-acetaminophen  (NORCO) 10-325 MG tablet Take 1 tablet by  mouth every 4 (four) hours as needed.     montelukast (SINGULAIR) 10 MG tablet Take 10 mg by mouth daily.     oxyCODONE -acetaminophen  (PERCOCET/ROXICET) 5-325 MG tablet Take 2 tablets by mouth every 4 (four) hours as needed for severe pain. 15 tablet 0   pantoprazole (PROTONIX) 40 MG tablet Take 40 mg by mouth daily.     No current facility-administered medications for this visit.    Allergies: Allergies  Allergen Reactions   Propofol Anaphylaxis    Past Medical History:  Diagnosis Date   Renal disorder    kidney stones   Past Surgical History:  Procedure Laterality Date   TONSILLECTOMY     No family history on file. Social History   Socioeconomic History   Marital status: Single    Spouse name: Not on file   Number of children: Not on file   Years of education: Not on file   Highest education level: Not on file  Occupational History   Not on file  Tobacco Use   Smoking status: Former   Smokeless tobacco: Never  Substance and Sexual Activity   Alcohol use: Yes    Comment: occ   Drug use: Never   Sexual activity: Not on file  Other Topics Concern   Not on file  Social History Narrative   Not on file   Social Drivers of Health   Financial Resource Strain: Not on file  Food Insecurity: Not on file  Transportation Needs: Not on file  Physical Activity: Not on file  Stress: Not on file  Social Connections: Not on file  Intimate Partner Violence: Not on file    SUBJECTIVE  Review of Systems Constitutional: Patient denies any unintentional weight loss or change in strength lntegumentary: Patient denies any rashes or pruritus Cardiovascular: Patient denies chest pain or syncope Respiratory: Patient denies shortness of breath Gastrointestinal: ***Patient denies nausea, vomiting, constipation, or diarrhea ***As per HPI Musculoskeletal: Patient denies muscle cramps or weakness Neurologic: Patient denies convulsions or seizures Allergic/Immunologic: Patient  denies recent allergic reaction(s) Hematologic/Lymphatic: Patient denies bleeding tendencies Endocrine: Patient denies heat/cold intolerance  GU: As per HPI.  OBJECTIVE There were no vitals filed for this visit. There is no height or weight on file to calculate BMI.  Physical Examination Constitutional: No obvious distress; patient is non-toxic appearing  Cardiovascular: No visible lower extremity edema.  Respiratory: The patient does not have audible wheezing/stridor; respirations do not appear labored  Gastrointestinal: Abdomen non-distended Musculoskeletal: Normal ROM of UEs  Skin: No obvious rashes/open sores  Neurologic: CN 2-12 grossly intact Psychiatric: Answered questions appropriately with normal affect  Hematologic/Lymphatic/Immunologic: No obvious bruises or sites of spontaneous bleeding  UA: ***negative ***positive for *** leukocytes, *** blood, ***nitrites Urine microscopy: *** WBC/hpf, *** RBC/hpf, *** bacteria ***glucosuria (secondary to ***Jardiance ***Farxiga use) ***otherwise unremarkable  PVR: *** ml  ASSESSMENT No diagnosis found.  ***We reviewed recent imaging results; ***awaiting radiology results, appears to have ***no acute findings per provider interpretation.  ***Advised adequate hydration and we discussed option to consider low oxalate diet given that calcium oxalate is the most common type of stone. Handout provided about stone prevention diet.  For acute GU stone symptoms we agreed to proceed with: - ***RUS and KUB / ***CT stone study to evaluate current stone burden.  - ***CMP to assess kidney function. - ***Flomax  0.4 mg daily for medical expulsive therapy (MET), which may improve passage of stone(s).  - For pain management, we discussed the use of opioids versus OTC analgesics. A 5 day prescription was sent for ***Percocet for PRN use for severe pain.  - For nausea / vomiting, a prescription was sent for ***Zofran  / ***Phenergan  for PRN  use.  ***We discussed the various treatment options including ***medical expulsive therapy (MET), ***extracorporeal shock wave lithotripsy (ESWL), ureteroscopic stone manipulation (URS), or ***percutaneous nephrolithotomy (PCNL). We discussed possible risks and benefits of intervention including but not limited to: including pain, infection, sepsis, UTI, ureter perforation, need for stenting, post-op ureteral stricture, hematuria.  ***Will consult with ***Dr. Sherrilee and notify patient of his recommendations ***for next steps / ***following review of imaging results.  Will plan to follow up in ***2 weeks ***6 months with ***KUB ***RUS for stone surveillance or sooner if needed.   He was advised to contact urology provider or go to the ER if He develops fever >101F, uncontrollable pain, or other significantly concerning symptoms prior to next office visit.  He verbalized understanding and agreement. All questions were answered.   PLAN Advised the following: ***Flomax  daily x2 weeks. ***Analgesics PRN for pain. ***Zofran  PRN for nausea. ***No follow-ups on file.  No orders of the defined types were placed in this encounter.   It has been explained that the patient is to follow regularly with their PCP in addition to all other providers involved in their care and to follow instructions provided by these respective offices. Patient advised to contact urology clinic if any urologic-pertaining questions, concerns, new symptoms or problems arise in the interim period.  There are no Patient Instructions on file for this visit.  Electronically signed by: Lauraine KYM Oz, MSN, FNP-C, CUNP 11/05/2023 12:34 PM

## 2023-11-07 ENCOUNTER — Ambulatory Visit: Admitting: Urology

## 2023-11-07 DIAGNOSIS — N200 Calculus of kidney: Secondary | ICD-10-CM

## 2023-11-07 NOTE — ED Provider Notes (Signed)
 [facility] Emergency Department Provider Note  Chief Complaint: Hand Injury (Pt was at work and got his hand stuck and crushed in a hydraulic pump injuring the L ring finder and pinky finger, fingers are blue/purple and numb. Pt stated it was stuck for about 1-2 min)   Time Seen     Event Date/Time   First Provider Evaluation 11/07/23 1238         History of Present Illness  History of Present Illness The patient presents for evaluation of left hand injury.  Patient has history of gastroesophageal reflux but otherwise no chronic medical conditions.  He was at work today and attempting to change hydraulic hose on the machine when his left hand became entrapped in the machine as it rotated.  He is right-handed and sustained an injury to his middle and ring fingers while handling a hydraulic hose. The incident resulted in bleeding, pain, and tingling sensations. No other injuries or symptoms reported.  Patient denies any significant pain in his forearm, elbow, upper arm, or shoulder.  He has some mild pain in the dorsum of his left hand as well as the fingers mentioned.  He had no other injuries to the rest of his body.  Supplemental Information Shoulder injury and acid reflux.    Physical Exam   Initial Vital Signs for the past 24 hrs (Last 1 readings):  BP Temp Pulse Resp SpO2 Weight  11/07/23 1225 (!) 137/95 98.5 F (36.9 C) (!) 120 18 95 % 129 kg (284 lb 6.3 oz)   Physical Exam  Gen: Well-developed, well-nourished.  No acute distress. HEENT: [place]/AT, PERRL, EOMI, No scleral injection or icterus. MMM without lesions. Neck: Supple with FROM without midline tenderness, trachea midline. No meningismus. Chest: CTA(b). No tachypnea. No increased WOB, no w/r/c, symmetric BS. No chest wall tenderness or crepitance. CV: RRR. No tachycardia. Well perfused extremities with 2+ distal pulses Neuro: Alert and with normal orientation. Age-appropriate interaction. No facial asymmetry.  Speaking easily without dysarthria. Normal strength and sensation grossly throughout. Ext: Swelling of the left hands ring finger and middle finger.  Small laceration involving the medial aspect of the eponychial fold of the left middle finger.  Able to flex and extend across left middle finger MCP PIP and DIP joints.  Left ring finger has limited flexion due to swelling and difficult to maintain extension because of pain.  Pain over the left 3rd and 4th MCP joints as well but without significant swelling of the hand.  No snuffbox tenderness.  Full flexion extension across the wrist.  Normal pronation and supination as well as flexion extension of the elbow.  Full range of motion across the shoulder.  Warm and well perfused.    Medical Decision Making   Medical Decision Making Problems Addressed: Crushing injury of finger, initial encounter: complicated acute illness or injury Laceration of left middle finger without foreign body, nail damage status unspecified, initial encounter: complicated acute illness or injury  Amount and/or Complexity of Data Reviewed Radiology: ordered.  Risk OTC drugs. Prescription drug management.    Assessment & Plan This is a 43 year old male who presents with crush type injury to his left hand, particularly the ring and middle fingers.  Patient has a small laceration to the medial aspect of the dorsal aspect of the left middle finger.  Nail plate is intact.  Do have concern for possible fractures, particularly of the ring finger.  The ring finger also may have significant crush type injury to the soft  tissue of the finger. - Administer pain medication - Order x-rays to evaluate for fracture and crush injury   Patient's presentation is most consistent with acute illness / injury with systematic symptoms.    Results  Results    Results: Labs Reviewed - No data to display  Imaging: XR Wrist Minimum 3 Views Left  Final Result by Claven SHAUNNA Labella,  MD (06/27 1337)  DATE OF SERVICE:  11/07/2023 1:32 pm    EXAM:  LEFT WRIST MINIMUM THREE VIEWS    CLINICAL HISTORY:  Injury.    COMPARISON:  None.    FINDINGS:  No evidence of acute fracture, dislocation or radiopaque foreign body.    IMPRESSION:  No acute bony findings.    THIS IS AN ELECTRONICALLY VERIFIED FINAL REPORT  11/07/2023 1:37 PM - Electronically signed by Claven Labella   Workstation: [id number]  [facility]    XR Hand Minimum 3 Views Left  Final Result by Claven SHAUNNA Labella, MD (06/27 1338)  DATE OF SERVICE:  11/07/2023 1:32 pm    EXAM:  LEFT HAND MINIMUM THREE VIEWS    CLINICAL HISTORY:  Injury.    COMPARISON:  None.    IMPRESSION:  Comminuted and grossly nondisplaced fracture of the middle phalanx of the   fourth digit. Associated soft tissue swelling.    Grossly nondisplaced fracture of the tuft of the distal phalanx of the   third digit.    THIS IS AN ELECTRONICALLY VERIFIED FINAL REPORT  11/07/2023 1:38 PM - Electronically signed by Claven Labella   Workstation: [id number]  [facility]       EKG: No results found for this or any previous visit (from the past 4464 hours).  Clinical Decision Support:   CHA2DS2-VASc Score: N/A      Glasgow Coma Scale Score: 15                   ED Course   ED Course as of 11/07/23 1559  Fri Nov 07, 2023  1335 Reviewed x-ray images myself.  There is comminuted fracture of the mid ring finger phalanx on the left.  Also tuft fracture of the middle finger.   [SF]  1340 Contacted orthopedic surgery who requested Tdap and Ancef administration and they will see. [SF]  1549 Orthopedic team completed evaluation and management.  Requested Keflex for a week and follow-up. [SF]    ED Course User Index [SF] Alyce CHRISTELLA Baseman, MD    ED Medications   Medications  HYDROmorphone  (DILAUDID ) 1 mg/mL injection 0.5 mg (0.5 mg intravenous Given 11/07/23 1451)  lidocaine 4%-EPINEPHrine-tetracaine 0.5% (L.E.T.)  topical gel 3 mL (3 mL topical Given 11/07/23 1301)  ibuprofen (MOTRIN) tablet 600 mg (600 mg oral Given 11/07/23 1255)  acetaminophen  (TYLENOL ) tablet 975 mg (975 mg oral Given 11/07/23 1255)  diphtheria-pertussis-tetanus (BOOSTRIX, Tdap) vaccine 0.5 mL (0.5 mL intramuscular Given 11/07/23 1416)  ceFAZolin (ANCEF) injection 2 g (2 g intravenous Given 11/07/23 1418)  morphine injection 4 mg (4 mg intravenous Given 11/07/23 1413)  lidocaine (XYLOCAINE) 20 mg/mL (2 %) injection  - ADS Override Pull (18 mL  Given by Other 11/07/23 1445)      Procedures   Procedures   Diagnosis & Disposition   Diagnosis: 1. Laceration of left middle finger without foreign body, nail damage status unspecified, initial encounter Acute  2. Crushing injury of finger, initial encounter Acute     Disposition:  Discharge   Prescriptions: ED Prescriptions     Medication Sig Dispense  Start Date End Date Auth. Provider   cephALEXin (KEFLEX) 500 mg capsule Take 1 capsule (500 mg total) by mouth 2 (two) times a day for 7 days. 14 capsule 11/07/2023 11/14/2023 Alyce CHRISTELLA Baseman, MD         Past Contributory History   Allergies: Patient has no known allergies.   Personal History: Medical History[1]  Surgical History[2] Family History[3]  Social History[4]  Home Medications: Current Outpatient Medications  Medication Instructions  . cephALEXin (KEFLEX) 500 mg, oral, 2 times daily         ALYCE EMERSON BASEMAN, MD [facility],  Department of Emergency Medicine        [1] History reviewed. No pertinent past medical history. [2] History reviewed. No pertinent surgical history. [3] No family history on file. [4]

## 2023-11-17 ENCOUNTER — Ambulatory Visit (HOSPITAL_BASED_OUTPATIENT_CLINIC_OR_DEPARTMENT_OTHER): Admitting: Pulmonary Disease

## 2023-12-04 DIAGNOSIS — G894 Chronic pain syndrome: Secondary | ICD-10-CM | POA: Diagnosis not present

## 2023-12-04 DIAGNOSIS — G4733 Obstructive sleep apnea (adult) (pediatric): Secondary | ICD-10-CM | POA: Diagnosis not present

## 2023-12-04 DIAGNOSIS — M47812 Spondylosis without myelopathy or radiculopathy, cervical region: Secondary | ICD-10-CM | POA: Diagnosis not present

## 2023-12-04 DIAGNOSIS — M792 Neuralgia and neuritis, unspecified: Secondary | ICD-10-CM | POA: Diagnosis not present

## 2023-12-16 ENCOUNTER — Ambulatory Visit: Admitting: Urology

## 2023-12-16 NOTE — Procedures (Signed)
 SABRA

## 2023-12-25 ENCOUNTER — Telehealth: Payer: Self-pay | Admitting: Nurse Practitioner

## 2023-12-25 NOTE — Telephone Encounter (Signed)
 Left Detailed message for Willie Cook to call back regarding rescheduling his split night sleep study. If he wishes to reschedule or needs to postpone due to his injury, he can contact us  to arrange a new date. Nothing further needed at this time.

## 2023-12-25 NOTE — Telephone Encounter (Signed)
 Copied from CRM [id number]. Topic: Appointments - Scheduling Inquiry for Clinic >> Dec 25, 2023  2:49 PM Russell PARAS wrote: Reason for CRM:   Pt is contacting clinic to reschedule his split night sleep study(original date 07/7). He sustained a work injury on 06/27 and was advised not to go forward with the study. He also had changes in insurance. Requests call back to reschedule  CB#  [phone]

## 2023-12-26 ENCOUNTER — Telehealth: Payer: Self-pay | Admitting: Nurse Practitioner

## 2023-12-26 NOTE — Telephone Encounter (Unsigned)
 Copied from CRM [id number]. Topic: Clinical - Lab/Test Results >> Dec 25, 2023  5:09 PM Rilla B wrote: Reason for CRM: Returning call regarding split night sleep study. Confirmed that a VM was left for patient and he stated he's still like a call back. Please call patient [phone].

## 2023-12-29 ENCOUNTER — Telehealth: Payer: Self-pay | Admitting: Orthopedic Surgery

## 2023-12-29 NOTE — Telephone Encounter (Signed)
 Per the patient's request, I have emailed him Tisha's #, he will contact her to sort our the WC.    Per Dr. VEAR:  Margrette, Taft BRAVO, MD  Surrett, Hope H IS IT WORKERS COMP?  IF SO  I CAN SEE ON A WEDS (AND NOT THIS WEDS)   IF IT WC WE NEED THEIR APPROVAL  IF ITS NOT WC THEN WE DONT  IN ANY EVENT THIS IS A WEDS TYPE APPT       Previous Messages    ----- Message ----- From: Wallene Slice H Sent: 12/29/2023   9:30 AM EDT To: Taft BRAVO Margrette, MD  Spoke w/this pt, Dr. Council is referring for his lt leg.  The patient stated that he got pulled into a machine (WC), had a hand injury (already seen someone for that), but now his leg is numb and asleep all the time.  He stated it's nerve damage.  He would need to be see somewhere else, correct?  Just wanted to confirm.  [phone]  Thanks,  [facility]

## 2024-02-03 ENCOUNTER — Telehealth: Payer: Self-pay | Admitting: Nurse Practitioner

## 2024-02-03 DIAGNOSIS — R0683 Snoring: Secondary | ICD-10-CM

## 2024-02-03 DIAGNOSIS — G4719 Other hypersomnia: Secondary | ICD-10-CM

## 2024-02-03 DIAGNOSIS — G4733 Obstructive sleep apnea (adult) (pediatric): Secondary | ICD-10-CM

## 2024-02-03 NOTE — Telephone Encounter (Signed)
 Per Nereida Stager, PA for in lab split night sleep study denied. Ordered HST. Please notify pt. He will likely still require an in lab study, pending results. Thanks.

## 2024-02-03 NOTE — Telephone Encounter (Signed)
 Pt.notified

## 2024-02-19 NOTE — Telephone Encounter (Signed)
 Notified Mr. Rials no answer unable to left voicemail.In-lab is canceled for 10/10. His Home Sleep study through [facility] was received and is on the way to be shipped to him. Please advise if anything further else is needed thank you.

## 2024-02-19 NOTE — Telephone Encounter (Signed)
 Patient was returning call he received . Relayed the message to patient that his lab was canceled and his sleep study is on the way to be shipped . Patient is requesting a call when home sleep study has been shipped  [phone] [Patient acknowledged information I relayed and is just requesting a call or a voicemail once sleep study has been shipped

## 2024-02-19 NOTE — Telephone Encounter (Signed)
 Spoke with patient and provided an update. He confirmed that he has updated his information with [employer] for the equipment to be sent. Provided him with the contact number for [employer] in case he needs further assistance. Nothing further needed at this time.

## 2024-02-20 ENCOUNTER — Encounter (HOSPITAL_BASED_OUTPATIENT_CLINIC_OR_DEPARTMENT_OTHER): Admitting: Pulmonary Disease

## 2024-02-24 ENCOUNTER — Encounter

## 2024-02-24 DIAGNOSIS — G4719 Other hypersomnia: Secondary | ICD-10-CM

## 2024-02-24 DIAGNOSIS — R0683 Snoring: Secondary | ICD-10-CM

## 2024-02-24 DIAGNOSIS — G4733 Obstructive sleep apnea (adult) (pediatric): Secondary | ICD-10-CM

## 2024-03-09 DIAGNOSIS — G4730 Sleep apnea, unspecified: Secondary | ICD-10-CM | POA: Diagnosis not present

## 2024-03-26 ENCOUNTER — Ambulatory Visit: Payer: Self-pay | Admitting: Nurse Practitioner

## 2024-03-26 ENCOUNTER — Telehealth: Payer: Self-pay

## 2024-03-26 NOTE — Telephone Encounter (Signed)
 Copied from CRM [id number]. Topic: Clinical - Lab/Test Results >> Mar 24, 2024  1:25 PM Willie Cook wrote: Reason for CRM: Pt is calling in for the results of his home sleep study. I did let him know that he can refer back to his PCP for those results when made available, but he stated his PCP retired and the practice closed down (Dr. Bertell). Pt stated he completed the home sleep study about a month ago.   Pt's phone number is [phone] ok to leave a vm.  Called and relayed results to pt - pt confirmed understanding Pt needs appt with katie for f'/u HST

## 2024-03-26 NOTE — Progress Notes (Signed)
 Severe OSA. Needs f/u to discuss next steps

## 2024-03-26 NOTE — Telephone Encounter (Signed)
 Attempted to call patient, no answer so I left a voicemail.

## 2024-03-26 NOTE — Progress Notes (Signed)
 I called and spoke with patient, provided results/recommendations per Izetta.  He verbalized understanding.  Scheduled him for a virtual visit on 11/19 at 11:30 am, advised to log on at 11:15 am and he will be sent a link to join the visit.  He did state that he already had a CPAP machine that he does not tolerate and he has used his father's travel Bipap machine and he sleeps like a baby.  I told him that Izetta can discuss a possible titration study with him during the visit.  Nothing further needed.

## 2024-03-31 ENCOUNTER — Encounter: Payer: Self-pay | Admitting: Nurse Practitioner

## 2024-03-31 ENCOUNTER — Telehealth (INDEPENDENT_AMBULATORY_CARE_PROVIDER_SITE_OTHER): Admitting: Nurse Practitioner

## 2024-03-31 DIAGNOSIS — G4733 Obstructive sleep apnea (adult) (pediatric): Secondary | ICD-10-CM | POA: Diagnosis not present

## 2024-03-31 DIAGNOSIS — E669 Obesity, unspecified: Secondary | ICD-10-CM

## 2024-03-31 DIAGNOSIS — Z6838 Body mass index (BMI) 38.0-38.9, adult: Secondary | ICD-10-CM

## 2024-03-31 NOTE — Assessment & Plan Note (Signed)
BMI 38. Healthy weight loss encouraged.  

## 2024-03-31 NOTE — Patient Instructions (Signed)
 Your sleep study showed very severe sleep apnea. We discussed how untreated sleep apnea puts an individual at risk for cardiac arrhthymias, pulm HTN, DM, stroke and increases their risk for daytime accidents. We also briefly reviewed treatment options including weight loss, side sleeping position, oral appliance, PAP therapy or referral to ENT for possible surgical options  With the severity of your sleep apnea, we need to get you back on PAP therapy. Given your difficulties with CPAP, I suspect you may need a BiPAP machine. I have ordered an urgent in lab titration study for further evaluation. They should call you within the week to get this set up We will call you once we have the titration results  Use caution when driving and pull over if you become sleepy  Follow up in 10-12 weeks with any sleep provider to see how PAP therapy is going, or sooner, if needed

## 2024-03-31 NOTE — Assessment & Plan Note (Signed)
 Very severe OSA. He is unable to tolerate CPAP. Improved results with family member's BiPAP suggest potential benefit from this therapy. Given severity and difficulties with PAP tolerance, will set him up with urgent in lab CPAP titration study for further evaluation. Healthy weight loss encouraged. Safe driving practices reviewed.  - discussed how weight can impact sleep and risk for sleep disordered breathing - discussed options to assist with weight loss: combination of diet modification, cardiovascular and strength training exercises   - had an extensive discussion regarding the adverse health consequences related to untreated sleep disordered breathing - specifically discussed the risks for hypertension, coronary artery disease, cardiac dysrhythmias, cerebrovascular disease, and diabetes - lifestyle modification discussed   - discussed how sleep disruption can increase risk of accidents, particularly when driving - safe driving practices were discussed  Patient Instructions  Your sleep study showed very severe sleep apnea. We discussed how untreated sleep apnea puts an individual at risk for cardiac arrhthymias, pulm HTN, DM, stroke and increases their risk for daytime accidents. We also briefly reviewed treatment options including weight loss, side sleeping position, oral appliance, PAP therapy or referral to ENT for possible surgical options  With the severity of your sleep apnea, we need to get you back on PAP therapy. Given your difficulties with CPAP, I suspect you may need a BiPAP machine. I have ordered an urgent in lab titration study for further evaluation. They should call you within the week to get this set up We will call you once we have the titration results  Use caution when driving and pull over if you become sleepy  Follow up in 10-12 weeks with any sleep provider to see how PAP therapy is going, or sooner, if needed

## 2024-03-31 NOTE — Progress Notes (Signed)
 @Patient  ID: Willie Cook, male    DOB: 1980-06-10, 43 y.o.   MRN: [id number]  Chief Complaint  Patient presents with   Follow-up    Fu HST    Referring provider: Bertell Satterfield, MD  Virtual Visit via Video Note  I connected with Willie Cook on 03/31/24 at 11:30 AM EST by a video enabled telemedicine application and verified that I am speaking with the correct person using two identifiers.  Location: Patient: Home Provider: Office   I discussed the limitations of evaluation and management by telemedicine and the availability of in person appointments. The patient expressed understanding and agreed to proceed.  History of Present Illness: 43 year old male, former smoker followed for severe OSA. Past medical history significant for allergies, GERD, anxiety, sleep apnea.   TEST/EVENTS:  02/24/2024 HST: AHI 76/h, SpO2 low 68%  07/31/2023: OV with Chima Astorino NP Willie Cook is a 43 year old male with sleep apnea. He has a history of severe sleep apnea confirmed by a home sleep study many years ago. He attempted CPAP therapy but found it intolerable due to excessive pressure, causing sensations of choking and inability to breathe. He discontinued CPAP after approximately two weeks. He tried using a travel BiPAP machine that was his father's, which he found more comfortable and effective, allowing him to sleep well. He's never had in lab titration study. Not currently on therapy.  He experiences snoring, daytime fatigue, and difficulty staying asleep. He often wakes up in the middle of the night, sometimes choking and gasping for air, and finds it challenging to return to sleep. He does not experience morning headaches but reports a sore throat upon waking and significant fatigue, making it difficult to get up in the morning. He does not take any medication for sleep and consumes minimal caffeine. 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 He notes that he sleeps better in a recliner with his feet elevated and snores less in this position. Denies any drowsy driving, sleep parasomnias/paralysis. His fiance and family members have observed that he stops breathing during sleep. He has tried various settings on the CPAP in past without success.  Goes to bed around 11 PM.  Falls asleep within 10 to 15 minutes on average.  Wakes 3 times or more most nights.  Gets up around 6 AM for work.  Usually sleeps in until 10 or 11 AM when he is off.  Weight is up 30 pounds over the last 2 years.  Last sleep study not available. No supplemental oxygen use. No significant cardiac history, diabetes or history of stroke. He is a former smoker.  Quit 10 years ago.  Drinks a few beers a week.  Lives with his fiance.  Works at [employer].  He is getting married in May. Family history of allergies, asthma, heart disease, sleep apnea. Epworth 12    03/31/2024: Today - follow up Patient presents today for follow up to discuss home sleep study results, which revealed very severe sleep apnea. He has had prior sleep studies with similar results. He has a CPAP at home but has been unable to tolerate it, despite pressure and mask changes. He used his father's BiPAP machine and actually felt like he could breathe and slept much better on it. He feels unchanged compared to his last visit. No issues with drowsy driving.   Allergies  Allergen Reactions  Propofol Anaphylaxis    There is no immunization history for the selected administration types on file for this patient.  Past Medical History:  Diagnosis Date   Renal disorder    kidney stones    Tobacco History: Social History   Tobacco Use  Smoking Status Former  Smokeless Tobacco Never   Counseling given: Not Answered   Outpatient Medications Prior to Visit  Medication Sig Dispense Refill   famotidine (PEPCID) 40 MG tablet Take 40 mg by mouth daily.      HYDROcodone-acetaminophen  (NORCO) 10-325 MG tablet Take 1 tablet by mouth every 4 (four) hours as needed.     montelukast (SINGULAIR) 10 MG tablet Take 10 mg by mouth daily.     oxyCODONE -acetaminophen  (PERCOCET/ROXICET) 5-325 MG tablet Take 2 tablets by mouth every 4 (four) hours as needed for severe pain. 15 tablet 0   pantoprazole (PROTONIX) 40 MG tablet Take 40 mg by mouth daily.     albuterol (VENTOLIN HFA) 108 (90 Base) MCG/ACT inhaler Inhale 2 puffs into the lungs every 4 (four) hours. (Patient not taking: Reported on 03/31/2024)     ALPRAZolam (XANAX) 0.5 MG tablet Take 1 tablet by mouth 4 (four) times daily as needed. (Patient not taking: Reported on 03/31/2024)  2   No facility-administered medications prior to visit.     Review of Systems: as above    Physical Exam: Patient is well-developed, well-nourished in no acute distress.  Resting comfortably at home.  No labored breathing.  Speech is clear and coherent with logical content.  Patient is alert and oriented at baseline.    Lab Results:  CBC No results found for: WBC, RBC, HGB, HCT, PLT, MCV, MCH, MCHC, RDW, LYMPHSABS, MONOABS, EOSABS, BASOSABS  BMET No results found for: NA, K, CL, CO2, GLUCOSE, BUN, CREATININE, CALCIUM, GFRNONAA, GFRAA  BNP No results found for: BNP   Imaging:  No results found.   Administration History     None           No data to display          No results found for: NITRICOXIDE      Assessment & Plan:   OSA (obstructive sleep apnea) Very severe OSA. He is unable to tolerate CPAP. Improved results with family member's BiPAP suggest potential benefit from this therapy. Given severity and difficulties with PAP tolerance, will set him up with urgent in lab CPAP titration study for further evaluation. Healthy weight loss encouraged. Safe driving practices reviewed.  - discussed how weight can impact sleep and risk for  sleep disordered breathing - discussed options to assist with weight loss: combination of diet modification, cardiovascular and strength training exercises   - had an extensive discussion regarding the adverse health consequences related to untreated sleep disordered breathing - specifically discussed the risks for hypertension, coronary artery disease, cardiac dysrhythmias, cerebrovascular disease, and diabetes - lifestyle modification discussed   - discussed how sleep disruption can increase risk of accidents, particularly when driving - safe driving practices were discussed  Patient Instructions  Your sleep study showed very severe sleep apnea. We discussed how untreated sleep apnea puts an individual at risk for cardiac arrhthymias, pulm HTN, DM, stroke and increases their risk for daytime accidents. We also briefly reviewed treatment options including weight loss, side sleeping position, oral appliance, PAP therapy or referral to ENT for possible surgical options  With the severity of your sleep apnea, we need to get you back on PAP therapy. Given your difficulties  with CPAP, I suspect you may need a BiPAP machine. I have ordered an urgent in lab titration study for further evaluation. They should call you within the week to get this set up We will call you once we have the titration results  Use caution when driving and pull over if you become sleepy  Follow up in 10-12 weeks with any sleep provider to see how PAP therapy is going, or sooner, if needed   Obesity (BMI 30-39.9) BMI 38. Healthy weight loss encouraged    I discussed the assessment and treatment plan with the patient. The patient was provided an opportunity to ask questions and all were answered. The patient agreed with the plan and demonstrated an understanding of the instructions.   The patient was advised to call back or seek an in-person evaluation if the symptoms worsen or if the condition fails to improve as  anticipated.  I provided 31 minutes of non-face-to-face time during this encounter.  Comer LULLA Rouleau, NP 03/31/2024  Pt aware and understands NP's role.

## 2024-04-27 ENCOUNTER — Encounter (HOSPITAL_BASED_OUTPATIENT_CLINIC_OR_DEPARTMENT_OTHER): Payer: Self-pay | Admitting: Pulmonary Disease

## 2024-04-28 ENCOUNTER — Ambulatory Visit (HOSPITAL_BASED_OUTPATIENT_CLINIC_OR_DEPARTMENT_OTHER): Admitting: Pulmonary Disease

## 2024-04-28 NOTE — Progress Notes (Addendum)
 Willie Medford Dakins, MD [facility]    Willie Cook Sep 22, 1980  [id number]    Encounter Diagnoses: Displaced fracture of middle phalanx of left ring finger, initial encounter for closed fracture   Subjective Patient ID: Willie Cook is a 43 y.o. male.     Chief Complaint:: Follow-up of the Left Hand   History of Present Illness The patient is a 43 year old male who presents for follow-up of a left ring finger middle phalanx fracture with concern for middle phalanx delayed union or impending nonunion.  He was last seen in mid-November. He reports an improvement in his condition, with increased mobility and decreased pain. However, cold weather exacerbates his discomfort, a fact he has communicated to his physical therapist. He has not yet consulted a primary care physician due to the retirement of his previous doctor. Currently, he is unable to perform single movements with his left ring finger but can achieve a tabletop position and make a fist. He is undergoing physical therapy.  SOCIAL HISTORY Occupations: Returned to work with 2 pound restrictions Exercise: Physical therapy    Objective: Objective   Physical Exam Musculoskeletal: Left ring finger: Continued stiffness with composite fist formation. Able to demonstrate about a 70-degree arc of motion across the PIP joint. Sensation to light touch grossly intact.  Results Imaging  - X-ray of the left ring finger: Slow and progressive healing across the fracture of the middle phalanx with apex volar angulation noted. No worsening or increased osteolysis observed.  Still with incomplete union.   Diagnostic Studies: XR RESULTS: No XR results found for this visit  MRI RESULTS: No results found for this or any previous visit from the past 90 days.   CT RESULTS: No results found for this or any previous visit from the past 90 days.   Assessment/Plan:   Assessment & Plan 1. Left  ring finger middle phalanx fracture: Improved movement and less pain are reported, although cold weather exacerbates the discomfort. Demonstrates about a 70-degree arc of motion across the PIP joint. Sensation to light touch is grossly intact. X-rays show slow and progressive healing across the fracture with apex volar angulation noted, and no worsening or increased osteolysis. Returned to work with 2-pound restrictions and a stronger repetitive grip. - Continue all current treatments, including the bone stimulator and physical therapy - Case manager present, treatment plan discussed  2. Vitamin D deficiency: Reports taking a lot of vitamin D supplements. No primary care physician's appointment until mid-January. - Assess vitamin D levels - Determine correct amount of supplementation  3. Testosterone level management: Needs to have testosterone levels addressed, possibly by a primary care physician. No primary care physician's appointment until mid-January. - Address testosterone levels  Follow-up - Follow up in 6 weeks with 3 views of the finger at the next office visit  Continued work restrictions as above.  The patient's rehabilitation professional was present at the end of the office visit and the treatment plan was discussed.  All questions were addressed to their satisfaction. This was performed in the presence of the patient    ADDENDUM:  05/20/23   Driving: In regards to the patient's questions about whether or not they can drive, the answer I am providingfollowing:  I have advised the patient that, in my opinion, it is safe for the patient's hand/hands/upper extremity to perform the act of driving.  In other words, the hands/extremities performing the act of driving should not cause additional harm, delayed healing,  or worsened condition to the extremity.   However, there is no accurate or perfect way for me to assess/measure whether or not it is safe, and/or legal, for the patient to  perform the active driving with the upper extremity in it's current state.  That decision must be made by the patient.  The patient is aware that, from a legal standpoint, they may be considered an impaired driver with the extremity in its current state.

## 2024-05-04 ENCOUNTER — Ambulatory Visit (HOSPITAL_BASED_OUTPATIENT_CLINIC_OR_DEPARTMENT_OTHER): Admitting: Pulmonary Disease

## 2024-05-26 ENCOUNTER — Ambulatory Visit

## 2024-05-26 VITALS — BP 117/83 | HR 66 | Temp 98.1°F | Ht 72.0 in | Wt 283.2 lb

## 2024-05-26 DIAGNOSIS — K219 Gastro-esophageal reflux disease without esophagitis: Secondary | ICD-10-CM

## 2024-05-26 DIAGNOSIS — G8929 Other chronic pain: Secondary | ICD-10-CM

## 2024-05-26 DIAGNOSIS — Z13220 Encounter for screening for lipoid disorders: Secondary | ICD-10-CM

## 2024-05-26 DIAGNOSIS — Z113 Encounter for screening for infections with a predominantly sexual mode of transmission: Secondary | ICD-10-CM

## 2024-05-26 DIAGNOSIS — R0981 Nasal congestion: Secondary | ICD-10-CM | POA: Diagnosis not present

## 2024-05-26 DIAGNOSIS — M25511 Pain in right shoulder: Secondary | ICD-10-CM

## 2024-05-26 DIAGNOSIS — R5383 Other fatigue: Secondary | ICD-10-CM

## 2024-05-26 DIAGNOSIS — Z23 Encounter for immunization: Secondary | ICD-10-CM

## 2024-05-26 DIAGNOSIS — G4733 Obstructive sleep apnea (adult) (pediatric): Secondary | ICD-10-CM

## 2024-05-26 DIAGNOSIS — R7989 Other specified abnormal findings of blood chemistry: Secondary | ICD-10-CM

## 2024-05-26 DIAGNOSIS — E669 Obesity, unspecified: Secondary | ICD-10-CM

## 2024-05-26 DIAGNOSIS — R053 Chronic cough: Secondary | ICD-10-CM

## 2024-05-26 DIAGNOSIS — E66812 Obesity, class 2: Secondary | ICD-10-CM | POA: Diagnosis not present

## 2024-05-26 DIAGNOSIS — E559 Vitamin D deficiency, unspecified: Secondary | ICD-10-CM

## 2024-05-26 MED ORDER — FAMOTIDINE 40 MG PO TABS: 40.0000 mg | ORAL_TABLET | Freq: Every day | ORAL | 1 refills | Status: AC

## 2024-05-26 MED ORDER — FLUTICASONE PROPIONATE 50 MCG/ACT NA SUSP: 2.0000 | Freq: Every day | NASAL | 6 refills | Status: AC

## 2024-05-26 MED ORDER — PANTOPRAZOLE SODIUM 40 MG PO TBEC: 40.0000 mg | DELAYED_RELEASE_TABLET | Freq: Every day | ORAL | 1 refills | Status: AC

## 2024-05-26 MED ORDER — BENZONATATE 100 MG PO CAPS: 100.0000 mg | ORAL_CAPSULE | Freq: Two times a day (BID) | ORAL | 0 refills | Status: AC | PRN

## 2024-05-26 MED ORDER — ALBUTEROL SULFATE HFA 108 (90 BASE) MCG/ACT IN AERS: 2.0000 | INHALATION_SPRAY | RESPIRATORY_TRACT | 2 refills | Status: AC

## 2024-05-26 MED ORDER — HYDROCODONE-ACETAMINOPHEN 10-325 MG PO TABS: 1.0000 | ORAL_TABLET | Freq: Every day | ORAL | 0 refills | Status: AC | PRN

## 2024-05-26 NOTE — Progress Notes (Signed)
 "  New Patient Office Visit  Subjective    Patient ID: Willie Cook, male    DOB: 1980/05/22  Age: 44 y.o. MRN: [id number]  HPI HANZ WINTERHALTER presents to establish care.  Currently follows up with an orthopedic hand surgeon and sleep medicine for OSA.  Has a history of chronic right shoulder pain, OSA, GERD, esophageal narrowing, vitamin D deficiency, testosterone deficiency.  Has had chronic right shoulder pain for years.  Has been on narcotics for pain control for this issue.  Reports that he only takes 1 hydrocodone  per day, but does not always take it daily.  Says that this helps the pain.  Has not followed up with orthopedics for this issue.  Also had a recent crush injury to his left hand and has been having pain from this as well.  Would like a referral for an orthopedic surgeon regarding his shoulder.  Says that the orthopedic specialist that he sees is a hand specialist.  Reports that at his last orthopedics appointment they completed lab work and he was found to have a vitamin D deficiency as well as low testosterone.  Patient has been taking 2000 IUs of vitamin D supplement daily.  Has been taking vitamin D supplementation for 1 month now.  Wants information on supplementation for low testosterone.  Endorses fatigue and decreased sexual performance.  Says that him and his wife would like to have children and is concerned about his fertility issues with his low testosterone.   Has a history of OSA.  Currently uses BiPAP at night for this.  Says that he was originally given a CPAP machine, but is unable to tolerate this.  Tolerates BiPAP much better.  Has an appointment with sleep medicine in February to get a new prescription for this.  Has a history of GERD.  Followed up with GI years ago for this, and an endoscopy was performed.  Patient reports that he did have some esophageal narrowing and they recommended a procedure for esophageal stretching.  Endorses trouble swallowing and food  getting stuck in his throat.  GERD symptoms are reportedly under control.  Patient takes Pepcid  and Protonix  daily and has for years.  Would like a referral to follow-up with GI for this.   Also reports a history of a cough and sinus congestion that has been persistent the last few months.  Denies any nasal drainage, fever, chills or body aches.  Denies sick contacts.   Cough is described as nonproductive and spasmodic.  Reports that cough is worse at night.  Was seen in urgent care for this issue, and they diagnosed him with bronchitis.  Has taken Tessalon  Perles and had relief from this.  Has used an albuterol  inhaler in the past for the symptoms but needs a refill on this.  Past Medical History:  Diagnosis Date   Renal disorder    kidney stones    Past Surgical History:  Procedure Laterality Date   TONSILLECTOMY      No family history on file.  Social History   Socioeconomic History   Marital status: Single    Spouse name: Not on file   Number of children: Not on file   Years of education: Not on file   Highest education level: Not on file  Occupational History   Not on file  Tobacco Use   Smoking status: Former   Smokeless tobacco: Never  Substance and Sexual Activity   Alcohol use: Yes    Comment: occ  Drug use: Never   Sexual activity: Not on file  Other Topics Concern   Not on file  Social History Narrative   Not on file   Social Drivers of Health   Tobacco Use: Medium Risk (04/28/2024)   Received from [facility]   Patient History    Smoking Tobacco Use: Former    Smokeless Tobacco Use: Never    Passive Exposure: Not on Actuary Strain: Not on file  Food Insecurity: Not on file  Transportation Needs: Not on file  Physical Activity: Not on file  Stress: Not on file  Social Connections: Not on file  Intimate Partner Violence: Not on file  Depression (PHQ2-9): Medium Risk (05/26/2024)   Depression (PHQ2-9)    PHQ-2 Score: 7  Alcohol  Screen: Not on file  Housing: Not on file  Utilities: Not on file  Health Literacy: Not on file   Review of Systems  Constitutional:  Positive for fatigue. Negative for activity change, appetite change, chills and fever.  HENT:  Positive for congestion and trouble swallowing. Negative for ear discharge, ear pain, postnasal drip, rhinorrhea, sinus pressure, sinus pain and sore throat.   Respiratory:  Positive for cough. Negative for chest tightness, shortness of breath and wheezing.   Cardiovascular:  Negative for chest pain, palpitations and leg swelling.  Gastrointestinal:  Negative for abdominal pain, constipation, diarrhea, nausea and vomiting.   Objective    BP 117/83   Pulse 66   Temp 98.1 F (36.7 C)   Ht 6' (1.829 m)   Wt 283 lb 3.2 oz (128.5 kg)   SpO2 95%   BMI 38.41 kg/m   Physical Exam Vitals and nursing note reviewed.  Constitutional:      General: He is not in acute distress.    Appearance: Normal appearance. He is not ill-appearing or toxic-appearing.  HENT:     Nose: Congestion present. No rhinorrhea.     Mouth/Throat:     Pharynx: No oropharyngeal exudate or posterior oropharyngeal erythema.  Cardiovascular:     Rate and Rhythm: Normal rate and regular rhythm.     Heart sounds: Normal heart sounds, S1 normal and S2 normal. No murmur heard. Pulmonary:     Effort: Pulmonary effort is normal. No respiratory distress.     Breath sounds: Normal breath sounds. No wheezing.  Musculoskeletal:     Right lower leg: No edema.     Left lower leg: No edema.  Neurological:     Mental Status: He is alert.  Psychiatric:        Mood and Affect: Mood normal.        Behavior: Behavior normal.        Thought Content: Thought content normal.        Judgment: Judgment normal.    Assessment & Plan:  1. Gastroesophageal reflux disease without esophagitis (Primary) -Agreed to keep patient on current regimen to manage GERD symptoms.  Would like patient to get reevaluated  with GI due to history of esophageal narrowing, and sent referral for this.  - pantoprazole  (PROTONIX ) 40 MG tablet; Take 1 tablet (40 mg total) by mouth daily.  Dispense: 30 tablet; Refill: 1 - famotidine  (PEPCID ) 40 MG tablet; Take 1 tablet (40 mg total) by mouth daily.  Dispense: 30 tablet; Refill: 1 - Ambulatory referral to Gastroenterology  2. Chronic right shoulder pain -Reviewed pain management plan with patient.  Discussed with patient that we do not do chronic pain management, and recommended a pain management  referral if this is something he will need long term.  Patient deferred pain management referral at this time, expressing a desire to eventually discontinue pain medications.  Prefers to pursue evaluation by orthopedics for alternative treatments such as injections or surgical options rather than long-term pain management.  Agreed to provide a limited refill of pain medication to bridge care until orthopedic evaluation.  Patient states medication is used intermittently rather than daily.  Given this, risk is felt to be low and refills appropriate at this time. - HYDROcodone -acetaminophen  (NORCO) 10-325 MG tablet; Take 1 tablet by mouth daily as needed.  Dispense: 30 tablet; Refill: 0 - Ambulatory referral to Orthopedic Surgery  3. OSA (obstructive sleep apnea) -Recommended patient to follow-up with sleep medicine for this.  Patient has appointment in February.  4. Low serum testosterone level in male -Advised patient that I would like to reevaluate testosterone labs at this time.  Educated patient to get these levels done between 8 AM and 10 AM.  If levels are below optimal range, agreed to refer patient to urology at that time. - Testosterone,Free and Total  5. Obesity (BMI 30-39.9) - Comprehensive metabolic panel with GFR  6. Vitamin D deficiency -Will evaluate vitamin D levels at this time.  Advised continued use of 2000 IUs of vitamin D daily.  Agreed to give vitamin D  prescription strength once labs come back, if warranted. - VITAMIN D 25 Hydroxy (Vit-D Deficiency, Fractures)  7. Fatigue, unspecified type - CBC with Differential/Platelet - TSH + free T4 - Testosterone,Free and Total - VITAMIN D 25 Hydroxy (Vit-D Deficiency, Fractures)  8. Screening for lipid disorders - Lipid panel  9. Screening for STD (sexually transmitted disease) - Hepatitis C antibody - HIV Antibody (routine testing w rflx)  10. Immunization due - Flu vaccine trivalent PF, 6mos and older(Flulaval,Afluria,Fluarix,Fluzone)  11. Persistent dry cough -Discussed with patient that his cough is most likely due to bronchitis or residual symptoms from a prior viral illness.  Advised patient that this may take a while to improve, and does not warrant antibiotic therapy.  -Recommended the use of Tessalon  Perles and albuterol  as needed for cough. -Discussed with patient that albuterol  is to be used on an as needed basis, and if he is using this more than 2 times per week then he needs to let us  know.  This may be a sign that symptoms are not being properly controlled.  Patient verbalizes understanding. -ED precautions reviewed including continuous shortness of breath or wheezing despite albuterol  inhaler use or cyanosis, and to seek immediate care if experiencing any of these symptoms.   - albuterol  (VENTOLIN  HFA) 108 (90 Base) MCG/ACT inhaler; Inhale 2 puffs into the lungs every 4 (four) hours.  Dispense: 8 g; Refill: 2 - benzonatate  (TESSALON ) 100 MG capsule; Take 1 capsule (100 mg total) by mouth 2 (two) times daily as needed for cough.  Dispense: 20 capsule; Refill: 0  12. Sinus congestion -Due to absence of sinus pain or nasal drainage discussed with patient that I do not think that he needs antibiotic therapy at this time.  Would like to treat this conservatively with Flonase . -Instructed patient on the correct use of Flonase .  Advised patient that this is a nasal steroid and may take  up to 7 days to start seeing improvements. -Advised patient that if he experiences double worsening, fever, body aches or chills, to let us  know.   - fluticasone  (FLONASE ) 50 MCG/ACT nasal spray; Place 2 sprays into both  nostrils daily.  Dispense: 16 g; Refill: 6    Return in about 6 months (around 11/23/2024).   Damien KATHEE Pringle, FNP  "

## 2024-05-28 ENCOUNTER — Telehealth: Payer: Self-pay | Admitting: Pharmacy Technician

## 2024-05-28 ENCOUNTER — Other Ambulatory Visit (HOSPITAL_COMMUNITY): Payer: Self-pay

## 2024-05-28 NOTE — Telephone Encounter (Signed)
 Pharmacy Patient Advocate Encounter  Received notification from [facility] that Prior Authorization for HYDROcodone -Acetaminophen  10-325MG  tablets has been CANCELLED due to    PA #/Case ID/Reference #: [id number]  Plan has a restriction for a 7 day supply. [facility] and they ran for the 7 days and advised that patient could refill in 7 days.

## 2024-06-02 ENCOUNTER — Other Ambulatory Visit: Payer: Self-pay

## 2024-06-02 ENCOUNTER — Ambulatory Visit: Admitting: Surgical

## 2024-06-02 DIAGNOSIS — M25511 Pain in right shoulder: Secondary | ICD-10-CM | POA: Diagnosis not present

## 2024-06-02 DIAGNOSIS — G8929 Other chronic pain: Secondary | ICD-10-CM

## 2024-06-09 ENCOUNTER — Encounter: Payer: Self-pay | Admitting: Surgical

## 2024-06-09 NOTE — Progress Notes (Signed)
 "  Office Visit Note   Patient: Willie Cook           Date of Birth: 12-Jan-1981           MRN: [id number] Visit Date: 06/02/2024 Requested by: Gerard Damien NOVAK, FNP [address] [place],  [place] [id number] PCP: Gerard Damien NOVAK, FNP  Subjective: Chief Complaint  Patient presents with   Shoulder Pain    Right    HPI: Willie Cook is a 44 y.o. male who presents to the office reporting right shoulder pain.  Patient describes pain for several years.  He states that his shoulder popped while playing baseball years ago.  He has seen another provider in the past who recommended surgery but he did not want to proceed with this.  He describes pain in the superior to lateral shoulder region without radiation down the arm.  Pain wakes him up at night.  He has decreased range of motion of the right shoulder relative to the left according to him.  Takes occasional hydrocodone  for this problem.  No history of prior surgery.  Has had injections but these never provide any relief for more than a few weeks.  He has associated numbness but this is more positional and does not really happen throughout the day.  He does have popping sensation in the shoulder with range of motion.  He is currently out of work for the last 7 months due to a crush injury to his finger in the left hand.  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  In addition to his shoulder, he does have history of GERD and sleep apnea..                ROS: All systems reviewed are negative as they relate to the chief complaint within the history of present illness.  Patient denies fevers or chills.  Assessment & Plan: Visit Diagnoses:  1. Chronic right shoulder pain     Plan: Impression is 44 year old male who presents for evaluation of right shoulder pain.  Has longstanding history of shoulder pain and has had surgery recommended to him in the past for this  problem.  Has not had any advanced imaging of the shoulder for several years (if he has had any at all which he cannot recall).  Has clicking/popping in the shoulder along with pain that refers into the bicep region that is reproducible on exam with O'Brien sign and palpation over the bicipital groove.  Also has a little bit of subtle weakness of external rotation of the right shoulder relative to the left.  With longstanding symptoms, and lack of resolution from prior injections, need MRI arthrogram of the right shoulder to further evaluate SLAP tear versus rotator cuff tear.  Follow-up after MRI to review results.  AP, scapular Y, axillary views of right shoulder reviewed.  No fracture or dislocation.  No significant degenerative changes noted.  No loss of acromiohumeral interval.  Follow-Up Instructions: No follow-ups on file.   Orders:  Orders Placed This Encounter  Procedures   DG Shoulder Right   MR SHOULDER RIGHT W CONTRAST   Arthrogram   No orders of the defined types were placed in this encounter.     Procedures: No procedures performed   Clinical Data: No additional findings.  Objective: Vital Signs: There were no vitals taken for this visit.  Physical Exam:  Constitutional:  Patient appears well-developed HEENT:  Head: Normocephalic Eyes:EOM are normal Neck: Normal range of motion Cardiovascular: Normal rate Pulmonary/chest: Effort normal Neurologic: Patient is alert Skin: Skin is warm Psychiatric: Patient has normal mood and affect  Ortho Exam: Ortho exam demonstrates ortho exam demonstrates left shoulder with 35 degrees X rotation, 90 degrees abduction, 180 degrees forward elevation which is compared with the right shoulder with 35 degrees external rotation, 100 degrees abduction, 180 degrees forward elevation passively.  No obvious deformity to inspection of the shoulder.  Axillary nerve is intact with deltoid firing.  2+ radial pulse of the bilateral upper  extremities.  Intact EPL, FPL, finger abduction, pronation/supination, bicep, tricep, deltoid.  Rotator cuff strength testing demonstrates infraspinatus weakness rated 5-/5 of the right shoulder relative to 5/5 strength in the left shoulder.  5/5 supra and subscap strength bilaterally..  Tender with palpation over bicipital groove moderately and AC joint mildly.  Negative crossarm adduction test.  Positive O'Brien sign.  Positive Neer's and Hawkins impingement signs.  No cellulitis or skin changes noted.  Mild to moderate crepitus noted with passive motion of the shoulder.  Negative external rotation lag sign.  Negative Hornblower sign.  Negative Spurling sign.  Negative Lhermitte sign.  No pain reproduced with cervical spine range of motion.  Specialty Comments:  No specialty comments available.  Imaging: No results found.   PMFS History: Patient Active Problem List   Diagnosis Date Noted   Low serum testosterone level in male 05/26/2024   Chronic right shoulder pain 05/26/2024   Gastroesophageal reflux disease without esophagitis 05/26/2024   Vitamin D deficiency 05/26/2024   Kidney stones 11/05/2023   OSA (obstructive sleep apnea) 07/31/2023   Obesity (BMI 30-39.9) 07/31/2023   Past Medical History:  Diagnosis Date   Renal disorder    kidney stones    No family history on file.  Past Surgical History:  Procedure Laterality Date   TONSILLECTOMY     Social History   Occupational History   Not on file  Tobacco Use   Smoking status: Former   Smokeless tobacco: Never  Substance and Sexual Activity   Alcohol use: Yes    Comment: occ   Drug use: Never   Sexual activity: Not on file        "

## 2024-06-11 ENCOUNTER — Ambulatory Visit: Payer: Self-pay

## 2024-06-11 DIAGNOSIS — E291 Testicular hypofunction: Secondary | ICD-10-CM

## 2024-06-11 DIAGNOSIS — E559 Vitamin D deficiency, unspecified: Secondary | ICD-10-CM

## 2024-06-11 MED ORDER — VITAMIN D (ERGOCALCIFEROL) 1.25 MG (50000 UNIT) PO CAPS: 50000.0000 [IU] | ORAL_CAPSULE | ORAL | 0 refills | Status: AC

## 2024-06-12 LAB — CBC WITH DIFFERENTIAL/PLATELET
Basophils Absolute: 0.1 10*3/uL (ref 0.0–0.2)
Basos: 1 %
EOS (ABSOLUTE): 0.4 10*3/uL (ref 0.0–0.4)
Eos: 3 %
Hematocrit: 49.5 % (ref 37.5–51.0)
Hemoglobin: 15.8 g/dL (ref 13.0–17.7)
Immature Grans (Abs): 0 10*3/uL (ref 0.0–0.1)
Immature Granulocytes: 0 %
Lymphocytes Absolute: 3.1 10*3/uL (ref 0.7–3.1)
Lymphs: 24 %
MCH: 27.5 pg (ref 26.6–33.0)
MCHC: 31.9 g/dL (ref 31.5–35.7)
MCV: 86 fL (ref 79–97)
Monocytes Absolute: 1.1 10*3/uL — ABNORMAL HIGH (ref 0.1–0.9)
Monocytes: 9 %
Neutrophils Absolute: 8.1 10*3/uL — ABNORMAL HIGH (ref 1.4–7.0)
Neutrophils: 63 %
Platelets: 369 10*3/uL (ref 150–450)
RBC: 5.74 x10E6/uL (ref 4.14–5.80)
RDW: 13.8 % (ref 11.6–15.4)
WBC: 12.9 10*3/uL — ABNORMAL HIGH (ref 3.4–10.8)

## 2024-06-12 LAB — COMPREHENSIVE METABOLIC PANEL WITH GFR
ALT: 35 [IU]/L (ref 0–44)
AST: 22 [IU]/L (ref 0–40)
Albumin: 4.3 g/dL (ref 4.1–5.1)
Alkaline Phosphatase: 89 [IU]/L (ref 47–123)
BUN/Creatinine Ratio: 18 (ref 9–20)
BUN: 12 mg/dL (ref 6–24)
Bilirubin Total: 0.7 mg/dL (ref 0.0–1.2)
CO2: 19 mmol/L — ABNORMAL LOW (ref 20–29)
Calcium: 9.6 mg/dL (ref 8.7–10.2)
Chloride: 104 mmol/L (ref 96–106)
Creatinine, Ser: 0.67 mg/dL — ABNORMAL LOW (ref 0.76–1.27)
Globulin, Total: 2.7 g/dL (ref 1.5–4.5)
Glucose: 99 mg/dL (ref 70–99)
Potassium: 4.3 mmol/L (ref 3.5–5.2)
Sodium: 141 mmol/L (ref 134–144)
Total Protein: 7 g/dL (ref 6.0–8.5)
eGFR: 119 mL/min/{1.73_m2}

## 2024-06-12 LAB — HIV ANTIBODY (ROUTINE TESTING W REFLEX): HIV Screen 4th Generation wRfx: NONREACTIVE

## 2024-06-12 LAB — LIPID PANEL
Chol/HDL Ratio: 3.7 ratio (ref 0.0–5.0)
Cholesterol, Total: 159 mg/dL (ref 100–199)
HDL: 43 mg/dL
LDL Chol Calc (NIH): 95 mg/dL (ref 0–99)
Triglycerides: 119 mg/dL (ref 0–149)
VLDL Cholesterol Cal: 21 mg/dL (ref 5–40)

## 2024-06-12 LAB — HEPATITIS C ANTIBODY: Hep C Virus Ab: NONREACTIVE

## 2024-06-12 LAB — VITAMIN D 25 HYDROXY (VIT D DEFICIENCY, FRACTURES): Vit D, 25-Hydroxy: 24.5 ng/mL — ABNORMAL LOW (ref 30.0–100.0)

## 2024-06-12 LAB — TSH+FREE T4
Free T4: 1.06 ng/dL (ref 0.82–1.77)
TSH: 3.35 u[IU]/mL (ref 0.450–4.500)

## 2024-06-12 LAB — TESTOSTERONE,FREE AND TOTAL
Testosterone, Free: 2.1 pg/mL — ABNORMAL LOW (ref 6.8–21.5)
Testosterone: 195 ng/dL — ABNORMAL LOW (ref 264–916)

## 2024-06-14 ENCOUNTER — Encounter (INDEPENDENT_AMBULATORY_CARE_PROVIDER_SITE_OTHER): Payer: Self-pay

## 2024-06-15 ENCOUNTER — Ambulatory Visit

## 2024-06-15 VITALS — BP 130/82 | HR 101 | Temp 98.1°F | Ht 72.0 in | Wt 290.4 lb

## 2024-06-15 DIAGNOSIS — G8929 Other chronic pain: Secondary | ICD-10-CM

## 2024-06-15 DIAGNOSIS — M25511 Pain in right shoulder: Secondary | ICD-10-CM

## 2024-06-15 DIAGNOSIS — E559 Vitamin D deficiency, unspecified: Secondary | ICD-10-CM

## 2024-06-15 DIAGNOSIS — R7989 Other specified abnormal findings of blood chemistry: Secondary | ICD-10-CM | POA: Diagnosis not present

## 2024-06-15 DIAGNOSIS — Z6839 Body mass index (BMI) 39.0-39.9, adult: Secondary | ICD-10-CM | POA: Diagnosis not present

## 2024-06-15 DIAGNOSIS — E669 Obesity, unspecified: Secondary | ICD-10-CM

## 2024-06-15 DIAGNOSIS — E66812 Obesity, class 2: Secondary | ICD-10-CM

## 2024-06-17 ENCOUNTER — Ambulatory Visit: Admitting: Internal Medicine

## 2024-06-30 ENCOUNTER — Ambulatory Visit: Admitting: Surgical

## 2024-07-05 ENCOUNTER — Ambulatory Visit (HOSPITAL_BASED_OUTPATIENT_CLINIC_OR_DEPARTMENT_OTHER): Admitting: Pulmonary Disease

## 2024-09-13 ENCOUNTER — Ambulatory Visit

## 2024-11-23 ENCOUNTER — Ambulatory Visit
